# Patient Record
Sex: Female | Born: 1965 | Race: Black or African American | Hispanic: No | Marital: Married | Smoking: Never smoker
Health system: Southern US, Community
[De-identification: ages and names within clinical notes are randomized; demographics above are authoritative.]

## PROBLEM LIST (undated history)

## (undated) DIAGNOSIS — Z5189 Encounter for other specified aftercare: Secondary | ICD-10-CM

## (undated) HISTORY — DX: Encounter for other specified aftercare: Z51.89

---

## 2012-10-17 DIAGNOSIS — R059 Cough, unspecified: Secondary | ICD-10-CM | POA: Insufficient documentation

## 2014-04-06 DIAGNOSIS — B009 Herpesviral infection, unspecified: Secondary | ICD-10-CM | POA: Insufficient documentation

## 2014-04-30 DIAGNOSIS — S62619A Displaced fracture of proximal phalanx of unspecified finger, initial encounter for closed fracture: Secondary | ICD-10-CM | POA: Insufficient documentation

## 2014-08-24 DIAGNOSIS — S76219A Strain of adductor muscle, fascia and tendon of unspecified thigh, initial encounter: Secondary | ICD-10-CM | POA: Insufficient documentation

## 2014-08-24 DIAGNOSIS — E669 Obesity, unspecified: Secondary | ICD-10-CM | POA: Insufficient documentation

## 2014-08-24 DIAGNOSIS — R928 Other abnormal and inconclusive findings on diagnostic imaging of breast: Secondary | ICD-10-CM | POA: Insufficient documentation

## 2015-06-10 HISTORY — PX: COSMETIC SURGERY: SHX468

## 2019-09-29 ENCOUNTER — Other Ambulatory Visit: Payer: Self-pay

## 2019-09-30 ENCOUNTER — Other Ambulatory Visit: Payer: Self-pay

## 2019-09-30 ENCOUNTER — Ambulatory Visit: Payer: Managed Care, Other (non HMO) | Attending: Internal Medicine

## 2019-09-30 DIAGNOSIS — Z20822 Contact with and (suspected) exposure to covid-19: Secondary | ICD-10-CM

## 2019-10-01 LAB — NOVEL CORONAVIRUS, NAA: SARS-CoV-2, NAA: NOT DETECTED

## 2020-01-15 ENCOUNTER — Telehealth: Payer: Self-pay

## 2020-01-15 NOTE — Telephone Encounter (Signed)
Copied from CRM [id number]. Topic: Appointment Scheduling - Scheduling Inquiry for Clinic >> Jan 14, 2020  4:57 PM Randol Kern wrote: Reason for CRM: [id number]  pt is requesting a call back to discuss later options for new patient appt. I see Dr. B has 3:40 slots available, is pt able to have that? Please advise

## 2020-01-16 NOTE — Telephone Encounter (Signed)
LMTCB

## 2020-01-19 NOTE — Telephone Encounter (Signed)
LMTCB

## 2020-02-03 ENCOUNTER — Other Ambulatory Visit: Payer: Self-pay | Admitting: Adult Health

## 2020-02-03 DIAGNOSIS — Z1231 Encounter for screening mammogram for malignant neoplasm of breast: Secondary | ICD-10-CM

## 2020-02-09 ENCOUNTER — Encounter: Payer: Self-pay | Admitting: Radiology

## 2020-02-09 ENCOUNTER — Ambulatory Visit
Admission: RE | Admit: 2020-02-09 | Discharge: 2020-02-09 | Disposition: A | Discharge status: Home / Self Care | Payer: Managed Care, Other (non HMO) | Source: Ambulatory Visit | Attending: Adult Health | Admitting: Adult Health

## 2020-02-09 DIAGNOSIS — Z1231 Encounter for screening mammogram for malignant neoplasm of breast: Secondary | ICD-10-CM | POA: Diagnosis not present

## 2020-02-17 NOTE — Progress Notes (Signed)
Screening mammogram repeat in 1 year recommended by radiologist unless clinically indicated sooner.

## 2020-03-01 ENCOUNTER — Other Ambulatory Visit: Payer: Self-pay

## 2020-03-01 ENCOUNTER — Encounter: Payer: Self-pay | Admitting: Adult Health

## 2020-03-01 ENCOUNTER — Ambulatory Visit (INDEPENDENT_AMBULATORY_CARE_PROVIDER_SITE_OTHER): Payer: Managed Care, Other (non HMO) | Admitting: Adult Health

## 2020-03-01 VITALS — BP 110/78 | HR 74 | Temp 96.6°F | Resp 16 | Ht 66.0 in | Wt 208.4 lb

## 2020-03-01 DIAGNOSIS — Z1389 Encounter for screening for other disorder: Secondary | ICD-10-CM | POA: Diagnosis not present

## 2020-03-01 DIAGNOSIS — R5383 Other fatigue: Secondary | ICD-10-CM | POA: Diagnosis not present

## 2020-03-01 DIAGNOSIS — E559 Vitamin D deficiency, unspecified: Secondary | ICD-10-CM | POA: Diagnosis not present

## 2020-03-01 DIAGNOSIS — Z13220 Encounter for screening for lipoid disorders: Secondary | ICD-10-CM

## 2020-03-01 DIAGNOSIS — L659 Nonscarring hair loss, unspecified: Secondary | ICD-10-CM

## 2020-03-01 DIAGNOSIS — Z6833 Body mass index (BMI) 33.0-33.9, adult: Secondary | ICD-10-CM | POA: Diagnosis not present

## 2020-03-01 DIAGNOSIS — B369 Superficial mycosis, unspecified: Secondary | ICD-10-CM | POA: Insufficient documentation

## 2020-03-01 LAB — POCT URINALYSIS DIPSTICK
Bilirubin, UA: NEGATIVE
Blood, UA: NEGATIVE
Glucose, UA: NEGATIVE
Ketones, UA: NEGATIVE
Leukocytes, UA: NEGATIVE
Nitrite, UA: NEGATIVE
Protein, UA: NEGATIVE
Spec Grav, UA: <=1.005 — AB (ref 1.010–1.025)
Urobilinogen, UA: 0.2 E.U./dL
pH, UA: 7.5 (ref 5.0–8.0)

## 2020-03-01 MED ORDER — KETOCONAZOLE 2 % EX CREA: 1.0000 "application " | TOPICAL_CREAM | Freq: Two times a day (BID) | CUTANEOUS | 2 refills | Status: DC

## 2020-03-01 NOTE — Progress Notes (Addendum)
New patient visit   Patient: Nancy Randolph   DOB: Feb 04, 1966   54 y.o. Female  MRN: [id number] Visit Date: 03/01/2020  Today's healthcare provider: Jairo Ben, FNP   Chief Complaint  Patient presents with  . New Patient (Initial Visit)   Subjective    Nancy Randolph is a 54 y.o. female who presents today as a new patient to establish care.  HPI  Patient reports that she feels well today and would like to address concerns of hair loss for the past year and possible rash on the back of her left back of foot that has been present for the past 6 months. Patient describes rash as itchy and has noticed discoloration of skin. Patient states that she has been using otc Vicks to help treat rash. Patient reports that she is staying active by walking daily and states that sleep patterns are fairly well, she does wake up at least twice a night to use restroom.   Mammogram was 02/17/2020 needs again yearly screening recommended around 02/16/2021  She reports her bowels are not regular, she has bowel movement every three days or so. Not a lot of fiber in her diet.  Patient  denies any fever, body aches,chills, chest pain, shortness of breath, nausea, vomiting, or  diarrhea.   Mcderma covid vaccine completed bith vaccines.   September 2016 abdominal plasty. She has had some mild tenderness near her incision line she reports her surgeon tlld her was normal. She has not had any worsening symptoms.    History of two c/sections. Tubal .  She reports her last PAP smear was 2019 was within normal. Denies any vaginal bleeding or spotting. Denies any Vaginal discharge. She reports history of fibroids. clarification on 03/03/18 patient reports no history of fibroids and a history of " swollen thyroid " in past.   She had colonoscopy she reports it was normal, repeat in 10 years.  She feels this was in 2016.  She any denies any rectal pain or bleeding.  Denies any dark stools.   She sees  eye doctor- wears glasses. She is due.  She sees dentist.   Patient  denies any fever, body aches,chills, rash, chest pain, shortness of breath, nausea, vomiting, or diarrhea.  Denies dizziness, lightheadedness, pre syncopal or syncopal episodes.   Past Medical History:  Diagnosis Date  . Blood transfusion without reported diagnosis    Past Surgical History:  Procedure Laterality Date  . CESAREAN SECTION  [id number]  . COSMETIC SURGERY  06/2015   Family Status  Relation Name Status  . Mat Aunt  (Not Specified)   Family History  Problem Relation Age of Onset  . Breast cancer Maternal Aunt    Social History   Socioeconomic History  . Marital status: Married    Spouse name: Not on file  . Number of children: Not on file  . Years of education: Not on file  . Highest education level: Not on file  Occupational History  . Not on file  Tobacco Use  . Smoking status: Never Smoker  . Smokeless tobacco: Never Used  Substance and Sexual Activity  . Alcohol use: Not Currently  . Drug use: Never  . Sexual activity: Yes  Other Topics Concern  . Not on file  Social History Narrative  . Not on file   Social Determinants of Health   Financial Resource Strain:   . Difficulty of Paying Living Expenses:   Food Insecurity:   . Worried About  Running Out of Food in the Last Year:   . Ran Out of Food in the Last Year:   Transportation Needs:   . Lack of Transportation (Medical):   Marland Kitchen Lack of Transportation (Non-Medical):   Physical Activity:   . Days of Exercise per Week:   . Minutes of Exercise per Session:   Stress:   . Feeling of Stress :   Social Connections:   . Frequency of Communication with Friends and Family:   . Frequency of Social Gatherings with Friends and Family:   . Attends Religious Services:   . Active Member of Clubs or Organizations:   . Attends Banker Meetings:   Marland Kitchen Marital Status:    Outpatient Medications Prior to Visit  Medication Sig  .  calcium elemental as carbonate (BARIATRIC TUMS ULTRA) 400 MG chewable tablet Chew 1,000 mg by mouth daily.  . Cholecalciferol (VITAMIN D3) 50 MCG (2000 UT) TABS Take 50 mcg by mouth daily.  . cyanocobalamin 1000 MCG tablet Take 1,000 mcg by mouth daily.  Marland Kitchen esomeprazole (NEXIUM) 20 MG capsule Take 20 mg by mouth as needed.  . NON FORMULARY Take 500 mg by mouth daily. Beet Root  . TURMERIC CURCUMIN PO Take 100 mg by mouth daily.   No facility-administered medications prior to visit.   Allergies  Allergen Reactions  . Codeine Hives    (codeine) Hives and itching  Hives and itching  Hives and itching    . Oseltamivir Itching    Other reaction(s): Other (See Comments), Other (See Comments)     There is no immunization history on file for this patient.  Health Maintenance  Topic Date Due  . Hepatitis C Screening  Never done  . COVID-19 Vaccine (1) Never done  . HIV Screening  Never done  . TETANUS/TDAP  Never done  . PAP SMEAR-Modifier  Never done  . COLONOSCOPY  Never done  . INFLUENZA VACCINE  05/09/2020  . MAMMOGRAM  02/08/2022    Patient Care Team: Berniece Pap, FNP as PCP - General (Family Medicine)  Review of Systems  Constitutional: Positive for fatigue. Negative for activity change, appetite change, chills, diaphoresis, fever and unexpected weight change.  HENT: Positive for tinnitus.   Respiratory: Negative.   Cardiovascular: Negative.   Gastrointestinal: Positive for abdominal distention and constipation.  Genitourinary: Negative.   Musculoskeletal: Positive for back pain.  Skin: Positive for rash. Negative for color change, pallor and wound.  Hematological: Negative.   Psychiatric/Behavioral: Negative.   All other systems reviewed and are negative.   Last CBC No results found for: WBC, HGB, HCT, MCV, MCH, RDW, PLT Last metabolic panel No results found for: GLUCOSE, NA, K, CL, CO2, BUN, CREATININE, GFRNONAA, GFRAA, CALCIUM, PHOS, PROT, ALBUMIN,  LABGLOB, AGRATIO, BILITOT, ALKPHOS, AST, ALT, ANIONGAP Last lipids No results found for: CHOL, HDL, LDLCALC, LDLDIRECT, TRIG, CHOLHDL Last hemoglobin A1c No results found for: HGBA1C Last thyroid functions No results found for: TSH, T3TOTAL, T4TOTAL, THYROIDAB Last vitamin D No results found for: 25OHVITD2, 25OHVITD3, VD25OH Last vitamin B12 and Folate No results found for: VITAMINB12, FOLATE    Objective    BP 110/78   Pulse 74   Temp (!) 96.6 F (35.9 C) (Oral)   Resp 16   Ht 5\' 6"  (1.676 m)   Wt 208 lb 6.4 oz (94.5 kg)   SpO2 98%   BMI 33.64 kg/m  Physical Exam Constitutional:      General: She is not in acute distress.  Appearance: Normal appearance. She is obese. She is not ill-appearing, toxic-appearing or diaphoretic.  Cardiovascular:     Rate and Rhythm: Normal rate and regular rhythm.     Pulses: Normal pulses.     Heart sounds: Normal heart sounds. No murmur. No friction rub. No gallop.   Pulmonary:     Effort: Pulmonary effort is normal. No respiratory distress.     Breath sounds: Normal breath sounds. No stridor. No wheezing, rhonchi or rales.  Chest:     Chest wall: No tenderness.  Abdominal:     General: Bowel sounds are normal. There is no distension or abdominal bruit.     Palpations: Abdomen is soft. There is no mass or pulsatile mass.     Tenderness: There is abdominal tenderness (see note ). There is no right CVA tenderness, left CVA tenderness, guarding or rebound.     Hernia: No hernia is present.       Comments: Surgical scar from past abdominoplasty, mildly tender with palpation.   Musculoskeletal:        General: No swelling, tenderness, deformity or signs of injury. Normal range of motion.     Cervical back: Normal range of motion and neck supple.     Right lower leg: No edema.     Left lower leg: No edema.       Feet:  Feet:     Right foot:     Protective Sensation: 10 sites tested.     Left foot:     Protective Sensation: 10 sites  tested.     Comments: Hyperpigmented patch of skin left anterior leg/ heel as marked on diagram.  Skin:    General: Skin is warm.     Capillary Refill: Capillary refill takes less than 2 seconds.     Findings: Rash present.  Neurological:     General: No focal deficit present.     Mental Status: She is alert and oriented to person, place, and time.  Psychiatric:        Mood and Affect: Mood normal.        Behavior: Behavior normal.        Thought Content: Thought content normal.        Judgment: Judgment normal.      Depression Screen PHQ 2/9 Scores 03/01/2020  PHQ - 2 Score 0  PHQ- 9 Score 0   No results found for any visits on 03/01/20.  Assessment & Plan        Hair loss - Plan: TSH, CBC with Differential/Platelet, Comprehensive Metabolic Panel (CMET), Fe+TIBC+Fer, B12  Body mass index (BMI) of 33.0-33.9 in adult - Plan: VITAMIN D 25 Hydroxy (Vit-D Deficiency, Fractures)  Vitamin D deficiency - Plan: VITAMIN D 25 Hydroxy (Vit-D Deficiency, Fractures)  Fatigue, unspecified type - Plan: TSH, CBC with Differential/Platelet, Comprehensive Metabolic Panel (CMET), B12, HgB A1c  Screening cholesterol level - Plan: Lipid panel  Fungal infection of skin - Plan: ketoconazole (NIZORAL) 2 % cream  Screening for blood or protein in urine - Plan: POCT urinalysis dipstick   Orders Placed This Encounter  Procedures  . TSH  . CBC with Differential/Platelet  . Comprehensive Metabolic Panel (CMET)    Employee ID [id number]  . Lipid panel    Employee ID [id number]  . VITAMIN D 25 Hydroxy (Vit-D Deficiency, Fractures)    Employee ID [phone]  . Fe+TIBC+Fer    Employee ID [id number]  . B12    Employee ID [id number]  . HgB A1c  .  POCT urinalysis dipstick   Start psyllium husks or metamucil per package instructions mixed with at least 8 ounces of liquid/ per package instructions for constipation.   Fungal rash  Meds ordered this encounter  Medications  . ketoconazole (NIZORAL) 2 % cream     Sig: Apply 1 application topically 2 (two) times daily.    Dispense:  15 g    Refill:  2   Return for CPE, PAP and follow up on abdominal discomfort constipations. Diet and exercise lifestyle changes recommended.   Return in about 3 months (around 06/01/2020), or if symptoms worsen or fail to improve, for at any time for any worsening symptoms, Go to Emergency room/ urgent care if worse. IWellington Hampshire Flinchum, FNP, have reviewed all documentation for this visit. The documentation on 03/01/20 for the exam, diagnosis, procedures, and orders are all accurate and complete.   Marcille Buffy, [place] [phone] (phone) [phone] (fax)  [place]

## 2020-03-01 NOTE — Patient Instructions (Addendum)
Psyllium granules or powder for solution What is this medicine? PSYLLIUM (SIL i yum) is a bulk-forming fiber laxative. This medicine is used to treat constipation. Increasing fiber in the diet may also help lower cholesterol and promote heart health for some people. This medicine may be used for other purposes; ask your health care provider or pharmacist if you have questions. COMMON BRAND NAME(S): Fiber Therapy, GenFiber, Geri-Mucil, Hydrocil, Konsyl, Metamucil, Metamucil MultiHealth, Mucilin, Natural Fiber Therapy, Reguloid What should I tell my health care provider before I take this medicine? They need to know if you have any of these conditions:  blockage in your bowel  difficulty swallowing  inflammatory bowel disease  phenylketonuria  stomach or intestine problems  sudden change in bowel habits lasting more than 2 weeks  an unusual or allergic reaction to psyllium, other medicines, dyes, or preservatives  pregnant or trying or get pregnant  breast-feeding How should I use this medicine? Mix this medicine into a full glass (240 mL) of water or other cool drink. Take this medicine by mouth. Follow the directions on the package labeling, or take as directed by your health care professional. Take your medicine at regular intervals. Do not take your medicine more often than directed. Talk to your pediatrician regarding the use of this medicine in children. While this drug may be prescribed for children as young as 56 years old for selected conditions, precautions do apply. Overdosage: If you think you have taken too much of this medicine contact a poison control center or emergency room at once. NOTE: This medicine is only for you. Do not share this medicine with others. What if I miss a dose? If you miss a dose, take it as soon as you can. If it is almost time for your next dose, take only that dose. Do not take double or extra doses. What may interact with this  medicine? Interactions are not expected. Take this product at least 2 hours before or after other medicines. This list may not describe all possible interactions. Give your health care provider a list of all the medicines, herbs, non-prescription drugs, or dietary supplements you use. Also tell them if you smoke, drink alcohol, or use illegal drugs. Some items may interact with your medicine. What should I watch for while using this medicine? Check with your doctor or health care professional if your symptoms do not start to get better or if they get worse. Stop using this medicine and contact your doctor or health care professional if you have rectal bleeding or if you have to treat your constipation for more than 1 week. These could be signs of a more serious condition. Drink several glasses of water a day while you are taking this medicine. This will help to relieve constipation and prevent dehydration. What side effects may I notice from receiving this medicine? Side effects that you should report to your doctor or health care professional as soon as possible:  allergic reactions like skin rash, itching or hives, swelling of the face, lips, or tongue  breathing problems  chest pain  nausea, vomiting  rectal bleeding  trouble swallowing Side effects that usually do not require medical attention (report to your doctor or health care professional if they continue or are bothersome):  bloating  gas  stomach cramps This list may not describe all possible side effects. Call your doctor for medical advice about side effects. You may report side effects to FDA at 1-800-FDA-1088. Where should I  keep my medicine? Keep out of the reach of children. Store at room temperature between 15 and 30 degrees C (59 and 86 degrees F). Protect from moisture. Throw away any unused medicine after the expiration date. NOTE: This sheet is a summary. It may not cover all possible information. If you have  questions about this medicine, talk to your doctor, pharmacist, or health care provider.  2020 Elsevier/Gold Standard (2018-02-19 15:41:08) Psyllium oral capsule What is this medicine? PSYLLIUM (SIL i yum) is a bulk-forming fiber laxative. This medicine is used to treat constipation. Increasing fiber in the diet may also help lower cholesterol and promote heart health for some people. This medicine may be used for other purposes; ask your health care provider or pharmacist if you have questions. COMMON BRAND NAME(S): GenFiber, Konsyl, Metamucil, Metamucil MultiHealth, Natural Fiber Laxative, Reguloid What should I tell my health care provider before I take this medicine? They need to know if you have any of these conditions:  blockage in your bowel  difficulty swallowing  inflammatory bowel disease  stomach or intestine problems  sudden change in bowel habits lasting more than 2 weeks  an unusual or allergic reaction to psyllium, other medicines, dyes, or preservatives  pregnant or trying or get pregnant  breast-feeding How should I use this medicine? Take this medicine by mouth with a full glass of water. Follow the directions on the package labeling, or take as directed by your health care professional. Take your medicine at regular intervals. Do not take your medicine more often than directed. Talk to your pediatrician regarding the use of this medicine in children. While this drug may be prescribed for children as young as 22 years of age for selected conditions, precautions do apply. Overdosage: If you think you have taken too much of this medicine contact a poison control center or emergency room at once. NOTE: This medicine is only for you. Do not share this medicine with others. What if I miss a dose? If you miss a dose, take it as soon as you can. If it is almost time for your next dose, take only that dose. Do not take double or extra doses. What may interact with this  medicine? Interactions are not expected. Take this product at least 2 hours before or after other medicines. This list may not describe all possible interactions. Give your health care provider a list of all the medicines, herbs, non-prescription drugs, or dietary supplements you use. Also tell them if you smoke, drink alcohol, or use illegal drugs. Some items may interact with your medicine. What should I watch for while using this medicine? Check with your doctor or health care professional if your symptoms do not start to get better or if they get worse. Stop using this medicine and contact your doctor or health care professional if you have rectal bleeding or if you have to treat your constipation for more than 1 week. These could be signs of a more serious condition. Drink several glasses of water a day while you are taking this medicine. This will help to relieve constipation and prevent dehydration. What side effects may I notice from receiving this medicine? Side effects that you should report to your doctor or health care professional as soon as possible:  allergic reactions like skin rash, itching or hives, swelling of the face, lips, or tongue  breathing problems  chest pain  nausea, vomiting  rectal bleeding  trouble swallowing Side effects that usually do not require medical  attention (report to your doctor or health care professional if they continue or are bothersome):  bloating  gas  stomach cramps This list may not describe all possible side effects. Call your doctor for medical advice about side effects. You may report side effects to FDA at 1-800-FDA-1088. Where should I keep my medicine? Keep out of the reach of children. Store at room temperature between 15 and 30 degrees C (59 and 86 degrees F). Protect from moisture. Throw away any unused medicine after the expiration date. NOTE: This sheet is a summary. It may not cover all possible information. If you have  questions about this medicine, talk to your doctor, pharmacist, or health care provider.  2020 Elsevier/Gold Standard (2018-02-19 15:56:42)   Ketoconazole skin cream What is this medicine? KETOCONAZOLE (kee toe KON na zole) is an antifungal medicine. This cream is used to treat certain kinds of fungal or yeast infections of the skin. This medicine may be used for other purposes; ask your health care provider or pharmacist if you have questions. COMMON BRAND NAME(S): Kuric, Nizoral What should I tell my health care provider before I take this medicine? They need to know if you have any of these conditions:  an unusual or allergic reaction to ketoconazole, itraconazole, miconazole, sulfites, other foods, dyes or preservatives  pregnant or trying to get pregnant  breast-feeding How should I use this medicine? This medicine is for external use only. Follow the directions on the prescription label. Wash your hands before and after use. If treating a hand or nail infection, wash hands before use only. Apply a thin layer of cream to cover the affected skin and surrounding area. You can cover the area with a sterile gauze dressing or bandage. Do not use an airtight bandage, such as a plastic covered bandage. Do not get the cream in your eyes. If you do, rinse out with plenty of cool tap water. Finish the full course prescribed by your doctor or health care professional even if you think your condition is better. Do not stop using except on the advice of your doctor or health care professional. Talk to your pediatrician regarding the use of this medicine in children. Special care may be needed. Overdosage: If you think you have taken too much of this medicine contact a poison control center or emergency room at once. NOTE: This medicine is only for you. Do not share this medicine with others. What if I miss a dose? If you miss a dose, use it as soon as you can. If it is almost time for your next dose,  use only that dose. Do not use double or extra doses. What may interact with this medicine? Interactions are not expected. Do not use any other skin products without telling your doctor or health care professional. This list may not describe all possible interactions. Give your health care provider a list of all the medicines, herbs, non-prescription drugs, or dietary supplements you use. Also tell them if you smoke, drink alcohol, or use illegal drugs. Some items may interact with your medicine. What should I watch for while using this medicine? Tell your doctor or health care professional if your symptoms do not begin to improve in 1 to 2 weeks. If you are using this medicine for jock itch be sure to dry the groin completely after bathing. Do not wear underwear that is tight fitting or made from synthetic fibers like rayon or nylon. Wear loose fitting, cotton underwear. What side effects may  I notice from receiving this medicine? Side effects that you should report to your doctor or health care professional as soon as possible:  allergic reactions like skin rash, itching or hives, swelling of the face, lips, or tongue  pain, tingling, numbness Side effects that usually do not require medical attention (report to your doctor or health care professional if they continue or are bothersome):  dry skin  skin irritation This list may not describe all possible side effects. Call your doctor for medical advice about side effects. You may report side effects to FDA at 1-800-FDA-1088. Where should I keep my medicine? Keep out of the reach of children. Store at room temperature between 15 and 30 degrees C (59 and 86 degrees F). Keep container tightly closed. Throw away any unused medicine after the expiration date. NOTE: This sheet is a summary. It may not cover all possible information. If you have questions about this medicine, talk to your doctor, pharmacist, or health care provider.  2020  Elsevier/Gold Standard (2017-05-09 15:51:40)   Fat and Cholesterol Restricted Eating Plan Getting too much fat and cholesterol in your diet may cause health problems. Choosing the right foods helps keep your fat and cholesterol at normal levels. This can keep you from getting certain diseases. Your doctor may recommend an eating plan that includes:  Total fat: ______% or less of total calories a day.  Saturated fat: ______% or less of total calories a day.  Cholesterol: less than _________mg a day.  Fiber: ______g a day. What are tips for following this plan? Meal planning  At meals, divide your plate into four equal parts: ? Fill one-half of your plate with vegetables and green salads. ? Fill one-fourth of your plate with whole grains. ? Fill one-fourth of your plate with low-fat (lean) protein foods.  Eat fish that is high in omega-3 fats at least two times a week. This includes mackerel, tuna, sardines, and salmon.  Eat foods that are high in fiber, such as whole grains, beans, apples, broccoli, carrots, peas, and barley. General tips   Work with your doctor to lose weight if you need to.  Avoid: ? Foods with added sugar. ? Fried foods. ? Foods with partially hydrogenated oils.  Limit alcohol intake to no more than 1 drink a day for nonpregnant women and 2 drinks a day for men. One drink equals 12 oz of beer, 5 oz of wine, or 1 oz of hard liquor. Reading food labels  Check food labels for: ? Trans fats. ? Partially hydrogenated oils. ? Saturated fat (g) in each serving. ? Cholesterol (mg) in each serving. ? Fiber (g) in each serving.  Choose foods with healthy fats, such as: ? Monounsaturated fats. ? Polyunsaturated fats. ? Omega-3 fats.  Choose grain products that have whole grains. Look for the word "whole" as the first word in the ingredient list. Cooking  Cook foods using low-fat methods. These include baking, boiling, grilling, and broiling.  Eat more  home-cooked foods. Eat at restaurants and buffets less often.  Avoid cooking using saturated fats, such as butter, cream, palm oil, palm kernel oil, and coconut oil. Recommended foods  Fruits  All fresh, canned (in natural juice), or frozen fruits. Vegetables  Fresh or frozen vegetables (raw, steamed, roasted, or grilled). Green salads. Grains  Whole grains, such as whole wheat or whole grain breads, crackers, cereals, and pasta. Unsweetened oatmeal, bulgur, barley, quinoa, or brown rice. Corn or whole wheat flour tortillas. Meats and other protein  foods  Ground beef (85% or leaner), grass-fed beef, or beef trimmed of fat. Skinless chicken or [place]. Ground chicken or [place]. Pork trimmed of fat. All fish and seafood. Egg whites. Dried beans, peas, or lentils. Unsalted nuts or seeds. Unsalted canned beans. Nut butters without added sugar or oil. Dairy  Low-fat or nonfat dairy products, such as skim or 1% milk, 2% or reduced-fat cheeses, low-fat and fat-free ricotta or cottage cheese, or plain low-fat and nonfat yogurt. Fats and oils  Tub margarine without trans fats. Light or reduced-fat mayonnaise and salad dressings. Avocado. Olive, canola, sesame, or safflower oils. The items listed above may not be a complete list of foods and beverages you can eat. Contact a dietitian for more information. Foods to avoid Fruits  Canned fruit in heavy syrup. Fruit in cream or butter sauce. Fried fruit. Vegetables  Vegetables cooked in cheese, cream, or butter sauce. Fried vegetables. Grains  White bread. White pasta. White rice. Cornbread. Bagels, pastries, and croissants. Crackers and snack foods that contain trans fat and hydrogenated oils. Meats and other protein foods  Fatty cuts of meat. Ribs, chicken wings, bacon, sausage, bologna, salami, chitterlings, fatback, hot dogs, bratwurst, and packaged lunch meats. Liver and organ meats. Whole eggs and egg yolks. Chicken and [place] with skin.  Fried meat. Dairy  Whole or 2% milk, cream, half-and-half, and cream cheese. Whole milk cheeses. Whole-fat or sweetened yogurt. Full-fat cheeses. Nondairy creamers and whipped toppings. Processed cheese, cheese spreads, and cheese curds. Beverages  Alcohol. Sugar-sweetened drinks such as sodas, lemonade, and fruit drinks. Fats and oils  Butter, stick margarine, lard, shortening, ghee, or bacon fat. Coconut, palm kernel, and palm oils. Sweets and desserts  Corn syrup, sugars, honey, and molasses. Candy. Jam and jelly. Syrup. Sweetened cereals. Cookies, pies, cakes, donuts, muffins, and ice cream. The items listed above may not be a complete list of foods and beverages you should avoid. Contact a dietitian for more information. Summary  Choosing the right foods helps keep your fat and cholesterol at normal levels. This can keep you from getting certain diseases.  At meals, fill one-half of your plate with vegetables and green salads.  Eat high-fiber foods, like whole grains, beans, apples, carrots, peas, and barley.  Limit added sugar, saturated fats, alcohol, and fried foods. This information is not intended to replace advice given to you by your health care provider. Make sure you discuss any questions you have with your health care provider. Document Revised: 05/29/2018 Document Reviewed: 06/12/2017 Elsevier Patient Education  2020 [employer].

## 2020-03-02 ENCOUNTER — Telehealth: Payer: Self-pay

## 2020-03-02 ENCOUNTER — Other Ambulatory Visit: Payer: Self-pay | Admitting: Adult Health

## 2020-03-02 DIAGNOSIS — Z1152 Encounter for screening for COVID-19: Secondary | ICD-10-CM

## 2020-03-02 LAB — COMPREHENSIVE METABOLIC PANEL
ALT: 17 IU/L (ref 0–32)
AST: 22 IU/L (ref 0–40)
Albumin/Globulin Ratio: 1.6 (ref 1.2–2.2)
Albumin: 4.4 g/dL (ref 3.8–4.9)
Alkaline Phosphatase: 85 IU/L (ref 48–121)
BUN/Creatinine Ratio: 13 (ref 9–23)
BUN: 15 mg/dL (ref 6–24)
Bilirubin Total: 0.2 mg/dL (ref 0.0–1.2)
CO2: 23 mmol/L (ref 20–29)
Calcium: 9.6 mg/dL (ref 8.7–10.2)
Chloride: 101 mmol/L (ref 96–106)
Creatinine, Ser: 1.15 mg/dL — ABNORMAL HIGH (ref 0.57–1.00)
GFR calc Af Amer: 62 mL/min/{1.73_m2} (ref >59)
GFR calc non Af Amer: 54 mL/min/{1.73_m2} — ABNORMAL LOW (ref >59)
Globulin, Total: 2.7 g/dL (ref 1.5–4.5)
Glucose: 83 mg/dL (ref 65–99)
Potassium: 4.8 mmol/L (ref 3.5–5.2)
Sodium: 139 mmol/L (ref 134–144)
Total Protein: 7.1 g/dL (ref 6.0–8.5)

## 2020-03-02 LAB — CBC WITH DIFFERENTIAL/PLATELET
Basophils Absolute: 0.1 10*3/uL (ref 0.0–0.2)
Basos: 1 %
EOS (ABSOLUTE): 0.2 10*3/uL (ref 0.0–0.4)
Eos: 3 %
Hematocrit: 38.9 % (ref 34.0–46.6)
Hemoglobin: 13.3 g/dL (ref 11.1–15.9)
Immature Grans (Abs): 0 10*3/uL (ref 0.0–0.1)
Immature Granulocytes: 0 %
Lymphocytes Absolute: 2.2 10*3/uL (ref 0.7–3.1)
Lymphs: 35 %
MCH: 27.6 pg (ref 26.6–33.0)
MCHC: 34.2 g/dL (ref 31.5–35.7)
MCV: 81 fL (ref 79–97)
Monocytes Absolute: 0.4 10*3/uL (ref 0.1–0.9)
Monocytes: 7 %
Neutrophils Absolute: 3.3 10*3/uL (ref 1.4–7.0)
Neutrophils: 54 %
Platelets: 242 10*3/uL (ref 150–450)
RBC: 4.82 x10E6/uL (ref 3.77–5.28)
RDW: 13.5 % (ref 11.7–15.4)
WBC: 6.1 10*3/uL (ref 3.4–10.8)

## 2020-03-02 LAB — VITAMIN D 25 HYDROXY (VIT D DEFICIENCY, FRACTURES): Vit D, 25-Hydroxy: 42.1 ng/mL (ref 30.0–100.0)

## 2020-03-02 LAB — IRON,TIBC AND FERRITIN PANEL
Ferritin: 112 ng/mL (ref 15–150)
Iron Saturation: 21 % (ref 15–55)
Iron: 68 ug/dL (ref 27–159)
Total Iron Binding Capacity: 322 ug/dL (ref 250–450)
UIBC: 254 ug/dL (ref 131–425)

## 2020-03-02 LAB — LIPID PANEL
Chol/HDL Ratio: 3.7 ratio (ref 0.0–4.4)
Cholesterol, Total: 190 mg/dL (ref 100–199)
HDL: 51 mg/dL (ref >39)
LDL Chol Calc (NIH): 123 mg/dL — ABNORMAL HIGH (ref 0–99)
Triglycerides: 87 mg/dL (ref 0–149)
VLDL Cholesterol Cal: 16 mg/dL (ref 5–40)

## 2020-03-02 LAB — VITAMIN B12: Vitamin B-12: 1018 pg/mL (ref 232–1245)

## 2020-03-02 LAB — HEMOGLOBIN A1C
Est. average glucose Bld gHb Est-mCnc: 117 mg/dL
Hgb A1c MFr Bld: 5.7 % — ABNORMAL HIGH (ref 4.8–5.6)

## 2020-03-02 LAB — TSH: TSH: 1.9 u[IU]/mL (ref 0.450–4.500)

## 2020-03-02 NOTE — Telephone Encounter (Signed)
-----   Message from Berniece Pap, FNP sent at 03/02/2020 12:57 PM EDT ----- TSH for thyroid within normal limits.  CBC is within normal limits  CMP mildly decreased kidney function, advise increased hydration with water, avoiding NSAID's ibuprofen, aleve, advil etc.  Total cholesterol. Discuss lifestyle modification with patient e.g. increase exercise, fiber, fruits, vegetables, lean meat, and omega 3/fish intake and decrease saturated fat.  Vitamin D level is within normal limits and she can continue taking her Vitamin D at 4,000 international units daily.  Iron level . Ferritin and iron binding within normal limits.  B12 within normal limits.  Glucose within normal range, she has mild elevation in A1C  - 5.7 - prediabetes, however glucose in normal at 83. Likely normal variant on Hemoglobin A1C. Continue healthy diet and increased exercise.  We can recheck CMP for kidney function fasting in 3 months.

## 2020-03-02 NOTE — Telephone Encounter (Signed)
Left message for patient to call office back, okay for [facility] triage to advise. KW 

## 2020-03-02 NOTE — Telephone Encounter (Signed)
I placed antibody test order  if you will send to lab. I have used diagnosis code of screening for covid.

## 2020-03-02 NOTE — Progress Notes (Signed)
TSH for thyroid within normal limits.  CBC is within normal limits  CMP mildly decreased kidney function, advise increased hydration with water, avoiding NSAID's ibuprofen, aleve, advil etc.  Total cholesterol. Discuss lifestyle modification with patient e.g. increase exercise, fiber, fruits, vegetables, lean meat, and omega 3/fish intake and decrease saturated fat.  Vitamin D level is within normal limits and she can continue taking her Vitamin D at 4,000 international units daily.  Iron level . Ferritin and iron binding within normal limits.  B12 within normal limits.  Glucose within normal range, she has mild elevation in A1C  - 5.7 - prediabetes, however glucose in normal at 83. Likely normal variant on Hemoglobin A1C. Continue healthy diet and increased exercise.  We can recheck CMP for kidney function fasting in 3 months.

## 2020-03-02 NOTE — Telephone Encounter (Signed)
Result note read to pt, verbalizes understanding. Pt states she had requested Covid antibodies screening be added to blood work from yesterday, did not see noted. Also states during exam she was asked her history and replied she was told at one time in the past her "Thyroid was swollen";  pt states it was noted in her chart H/O fibroids. States she just wanted Ms. Flinchum to be aware of error. Aware of lab f/u in 3 months.

## 2020-03-02 NOTE — Telephone Encounter (Signed)
This was not a lab ordered, do you want to order this or should I triage patient? KW

## 2020-03-02 NOTE — Telephone Encounter (Signed)
Copied from CRM [id number]. Topic: General - Call Back - No Documentation >> Mar 01, 2020  4:55 PM Nancy Randolph wrote: Reason for CRM: Patient was seen on today for lab work and wanted to make sue that the Covid-19 antibodies lab work was included as well.

## 2020-03-03 NOTE — Telephone Encounter (Signed)
Covid antibodies lab ok to add on and I added to orders in epic.   Please clarify the below message with patient, she did ask me in the room the difference between the thyroid and fibroids and I explained the difference and location. I understood her to say she had a history of fibroids in her uterus in the past as well.   Her thyroid function test and exam of thyroid in office was normal. I will be happy to check it again in the office, at  follow up. Had she previously had any work up or treatment for her thyroid that I am not aware of or did it resolve ?

## 2020-03-03 NOTE — Telephone Encounter (Signed)
Will remove from note. Patient did ask question of " what is the difference in thyroid and fibroids" and was clarified in office by provider. Will follow up more at next office visit regarding history of swollen thyroid.

## 2020-03-03 NOTE — Telephone Encounter (Signed)
Spoke with patient and she states that you mistook her in the room and apologizes if fibroid and thyroid sounded similar. Patient states that over ten years ago when she was having exam done doctor had mentioned that her thyroid was swollen, patient states she is unsure if it was on left or right side but states that since exams from then on she was never told it was swollen or she had a thyroid problem. KW

## 2020-03-03 NOTE — Progress Notes (Signed)
Patient ID: Nancy Randolph, female   DOB: 12-10-1965, 54 y.o.   MRN: [id number]  Addendum: patient reports history of swollen thyroid in past. On exam was normal at last office visit. Patient did ask provider " what is the difference in thyroid and fibroid " question was answered. Provider must have misunderstood patient saying she had a history of fibroids. She clarified to CMA on phone after visit and reviewing note in [facility] she denies any history of fibroids.  Will follow up on thyroid at next visit.

## 2020-03-10 LAB — SPECIMEN STATUS REPORT

## 2020-03-15 ENCOUNTER — Telehealth: Payer: Self-pay

## 2020-03-15 NOTE — Telephone Encounter (Signed)
Patient returned call and states that she did reviewed her lab results in MyChart.

## 2020-03-15 NOTE — Telephone Encounter (Signed)
-----   Message from Fonda Kinder, CMA sent at 03/10/2020  4:14 PM EDT ----- Patient called.  Left message for patient to call back.

## 2020-03-15 NOTE — Telephone Encounter (Signed)
Tried calling patient and no answer, advised patient to return call if patient calls back okay for [facility] to advise patient of results. Also patient was advised that she could review labs/message via MyChart.

## 2020-03-15 NOTE — Telephone Encounter (Signed)
Results viewed in MyChart on 03/15/20.

## 2020-03-18 ENCOUNTER — Ambulatory Visit: Payer: Managed Care, Other (non HMO) | Admitting: Family Medicine
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
## 2020-04-09 ENCOUNTER — Other Ambulatory Visit: Payer: Self-pay

## 2020-04-09 ENCOUNTER — Encounter: Payer: Self-pay | Admitting: Adult Health

## 2020-04-09 ENCOUNTER — Ambulatory Visit (INDEPENDENT_AMBULATORY_CARE_PROVIDER_SITE_OTHER): Payer: Managed Care, Other (non HMO) | Admitting: Adult Health

## 2020-04-09 VITALS — BP 112/68 | HR 80 | Temp 97.6°F | Resp 16 | Ht 66.0 in | Wt 211.0 lb

## 2020-04-09 DIAGNOSIS — J0110 Acute frontal sinusitis, unspecified: Secondary | ICD-10-CM

## 2020-04-09 DIAGNOSIS — M25552 Pain in left hip: Secondary | ICD-10-CM

## 2020-04-09 DIAGNOSIS — G44209 Tension-type headache, unspecified, not intractable: Secondary | ICD-10-CM

## 2020-04-09 DIAGNOSIS — B369 Superficial mycosis, unspecified: Secondary | ICD-10-CM | POA: Diagnosis not present

## 2020-04-09 DIAGNOSIS — H6983 Other specified disorders of Eustachian tube, bilateral: Secondary | ICD-10-CM

## 2020-04-09 DIAGNOSIS — N289 Disorder of kidney and ureter, unspecified: Secondary | ICD-10-CM

## 2020-04-09 DIAGNOSIS — H6501 Acute serous otitis media, right ear: Secondary | ICD-10-CM

## 2020-04-09 MED ORDER — FLUTICASONE PROPIONATE 50 MCG/ACT NA SUSP: 2.0000 | Freq: Every day | NASAL | 1 refills | Status: DC

## 2020-04-09 MED ORDER — CETIRIZINE HCL 10 MG PO TABS: 10.0000 mg | ORAL_TABLET | Freq: Every day | ORAL | 0 refills | Status: AC

## 2020-04-09 MED ORDER — AMOXICILLIN-POT CLAVULANATE 875-125 MG PO TABS: 1.0000 | ORAL_TABLET | Freq: Two times a day (BID) | ORAL | 0 refills | Status: DC

## 2020-04-09 MED ORDER — CETIRIZINE HCL 10 MG PO TABS: 10.0000 mg | ORAL_TABLET | Freq: Every day | ORAL | 0 refills | Status: DC

## 2020-04-09 MED ORDER — KETOCONAZOLE 2 % EX SHAM: 1.0000 "application " | MEDICATED_SHAMPOO | CUTANEOUS | 0 refills | Status: AC

## 2020-04-09 NOTE — Patient Instructions (Addendum)
Otitis Media, Adult  Otitis media means that the middle ear is red and swollen (inflamed) and full of fluid. The condition usually goes away on its own. Follow these instructions at home:  Take over-the-counter and prescription medicines only as told by your doctor.  If you were prescribed an antibiotic medicine, take it as told by your doctor. Do not stop taking the antibiotic even if you start to feel better.  Keep all follow-up visits as told by your doctor. This is important. Contact a doctor if:  You have bleeding from your nose.  There is a lump on your neck.  You are not getting better in 5 days.  You feel worse instead of better. Get help right away if:  You have pain that is not helped with medicine.  You have swelling, redness, or pain around your ear.  You get a stiff neck.  You cannot move part of your face (paralyzed).  You notice that the bone behind your ear hurts when you touch it.  You get a very bad headache. Summary  Otitis media means that the middle ear is red, swollen, and full of fluid.  This condition usually goes away on its own. In some cases, treatment may be needed.  If you were prescribed an antibiotic medicine, take it as told by your doctor. This information is not intended to replace advice given to you by your health care provider. Make sure you discuss any questions you have with your health care provider. Document Revised: 09/07/2017 Document Reviewed: 10/16/2016 Elsevier Patient Education  2020 [employer] Fluticasone nasal spray What is this medicine? FLUTICASONE (floo TIK a sone) is a corticosteroid. This medicine is used to treat the symptoms of allergies like sneezing, itchy red eyes, and itchy, runny, or stuffy nose. This medicine is also used to treat nasal polyps. This medicine may be used for other purposes; ask your health care provider or pharmacist if you have questions. COMMON BRAND NAME(S): ClariSpray, Flonase,  Flonase Allergy Relief, Flonase Sensimist, Veramyst, XHANCE What should I tell my health care provider before I take this medicine? They need to know if you have any of these conditions:  eye disease, vision problems  infection, like tuberculosis, herpes, or fungal infection  recent surgery on nose or sinuses  taking a corticosteroid by mouth  an unusual or allergic reaction to fluticasone, steroids, other medicines, foods, dyes, or preservatives  pregnant or trying to get pregnant  breast-feeding How should I use this medicine? This medicine is for use in the nose. Follow the directions on your product or prescription label. This medicine works best if used at regular intervals. Do not use more often than directed. Make sure that you are using your nasal spray correctly. After 6 months of daily use for allergies, talk to your doctor or health care professional before using it for a longer time. Ask your doctor or health care professional if you have any questions. Talk to your pediatrician regarding the use of this medicine in children. Special care may be needed. Some products have been used for allergies in children as young as 2 years. After 2 months of daily use without a prescription in a child, talk to your pediatrician before using it for a longer time. Use of this medicine for nasal polyps is not approved in children. Overdosage: If you think you have taken too much of this medicine contact a poison control center or emergency room at once. NOTE: This medicine is  only for you. Do not share this medicine with others. What if I miss a dose? If you miss a dose, use it as soon as you remember. If it is almost time for your next dose, use only that dose and continue with your regular schedule. Do not use double or extra doses. What may interact with this medicine?  certain antibiotics like clarithromycin and telithromycin  certain medicines for fungal infections like ketoconazole,  itraconazole, and voriconazole  conivaptan  nefazodone  some medicines for HIV  vaccines This list may not describe all possible interactions. Give your health care provider a list of all the medicines, herbs, non-prescription drugs, or dietary supplements you use. Also tell them if you smoke, drink alcohol, or use illegal drugs. Some items may interact with your medicine. What should I watch for while using this medicine? Visit your healthcare professional for regular checks on your progress. Tell your healthcare professional if your symptoms do not start to get better or if they get worse. This medicine may increase your risk of getting an infection. Tell your doctor or health care professional if you are around anyone with measles or chickenpox, or if you develop sores or blisters that do not heal properly. What side effects may I notice from receiving this medicine? Side effects that you should report to your doctor or health care professional as soon as possible:  allergic reactions like skin rash, itching or hives, swelling of the face, lips, or tongue  changes in vision  crusting or sores in the nose  nosebleed  signs and symptoms of infection like fever or chills; cough; sore throat  white patches or sores in the mouth or nose Side effects that usually do not require medical attention (report to your doctor or health care professional if they continue or are bothersome):  burning or irritation inside the nose or throat  changes in taste or smell  cough  headache This list may not describe all possible side effects. Call your doctor for medical advice about side effects. You may report side effects to FDA at 1-800-FDA-1088. Where should I keep my medicine? Keep out of the reach of children. Store at room temperature between 15 and 30 degrees C (59 and 86 degrees F). Avoid exposure to extreme heat, cold, or light. Throw away any unused medicine after the expiration  date. NOTE: This sheet is a summary. It may not cover all possible information. If you have questions about this medicine, talk to your doctor, pharmacist, or health care provider.  2020 Elsevier/Gold Standard (2017-10-18 14:10:08) Cetirizine tablets What is this medicine? CETIRIZINE (se TI ra zeen) is an antihistamine. This medicine is used to treat or prevent symptoms of allergies. It is also used to help reduce itchy skin rash and hives. This medicine may be used for other purposes; ask your health care provider or pharmacist if you have questions. COMMON BRAND NAME(S): All Day Allergy, Allergy Relief, Zyrtec, Zyrtec Hives Relief What should I tell my health care provider before I take this medicine? They need to know if you have any of these conditions:  kidney disease  liver disease  an unusual or allergic reaction to cetirizine, hydroxyzine, other medicines, foods, dyes, or preservatives  pregnant or trying to get pregnant  breast-feeding How should I use this medicine? Take this medicine by mouth with a glass of water. Follow the directions on the prescription label. You can take this medicine with food or on an empty stomach. Take your medicine  at regular times. Do not take more often than directed. You may need to take this medicine for several days before your symptoms improve. Talk to your pediatrician regarding the use of this medicine in children. Special care may be needed. While this drug may be prescribed for children as young as 65 years of age for selected conditions, precautions do apply. Overdosage: If you think you have taken too much of this medicine contact a poison control center or emergency room at once. NOTE: This medicine is only for you. Do not share this medicine with others. What if I miss a dose? If you miss a dose, take it as soon as you can. If it is almost time for your next dose, take only that dose. Do not take double or extra doses. What may interact  with this medicine?  alcohol  certain medicines for anxiety or sleep  narcotic medicines for pain  other medicines for colds or allergies This list may not describe all possible interactions. Give your health care provider a list of all the medicines, herbs, non-prescription drugs, or dietary supplements you use. Also tell them if you smoke, drink alcohol, or use illegal drugs. Some items may interact with your medicine. What should I watch for while using this medicine? Visit your doctor or health care professional for regular checks on your health. Tell your doctor if your symptoms do not improve. You may get drowsy or dizzy. Do not drive, use machinery, or do anything that needs mental alertness until you know how this medicine affects you. Do not stand or sit up quickly, especially if you are an older patient. This reduces the risk of dizzy or fainting spells. Your mouth may get dry. Chewing sugarless gum or sucking hard candy, and drinking plenty of water may help. Contact your doctor if the problem does not go away or is severe. What side effects may I notice from receiving this medicine? Side effects that you should report to your doctor or health care professional as soon as possible:  allergic reactions like skin rash, itching or hives, swelling of the face, lips, or tongue  changes in vision or hearing  fast or irregular heartbeat  trouble passing urine or change in the amount of urine Side effects that usually do not require medical attention (report to your doctor or health care professional if they continue or are bothersome):  dizziness  dry mouth  irritability  sore throat  stomach pain  tiredness This list may not describe all possible side effects. Call your doctor for medical advice about side effects. You may report side effects to FDA at 1-800-FDA-1088. Where should I keep my medicine? Keep out of the reach of children. Store at room temperature between 15  and 30 degrees C (59 and 86 degrees F). Throw away any unused medicine after the expiration date. NOTE: This sheet is a summary. It may not cover all possible information. If you have questions about this medicine, talk to your doctor, pharmacist, or health care provider.  2020 Elsevier/Gold Standard (2014-10-20 13:44:42) Amoxicillin; Clavulanic Acid Tablets What is this medicine? AMOXICILLIN; CLAVULANIC ACID (a mox i SIL in; KLAV yoo lan ic AS id) is a penicillin antibiotic. It treats some infections caused by bacteria. It will not work for colds, the flu, or other viruses. This medicine may be used for other purposes; ask your health care provider or pharmacist if you have questions. COMMON BRAND NAME(S): Augmentin What should I tell my health care provider  before I take this medicine? They need to know if you have any of these conditions:  bowel disease, like colitis  kidney disease  liver disease  mononucleosis  an unusual or allergic reaction to amoxicillin, penicillin, cephalosporin, other antibiotics, clavulanic acid, other medicines, foods, dyes, or preservatives  pregnant or trying to get pregnant  breast-feeding How should I use this medicine? Take this drug by mouth. Take it as directed on the prescription label at the same time every day. Take it with food at the start of a meal or snack. Take all of this drug unless your health care provider tells you to stop it early. Keep taking it even if you think you are better. Talk to your health care provider about the use of this drug in children. While it may be prescribed for selected conditions, precautions do apply. Overdosage: If you think you have taken too much of this medicine contact a poison control center or emergency room at once. NOTE: This medicine is only for you. Do not share this medicine with others. What if I miss a dose? If you miss a dose, take it as soon as you can. If it is almost time for your next dose,  take only that dose. Do not take double or extra doses. What may interact with this medicine?  allopurinol  anticoagulants  birth control pills  methotrexate  probenecid This list may not describe all possible interactions. Give your health care provider a list of all the medicines, herbs, non-prescription drugs, or dietary supplements you use. Also tell them if you smoke, drink alcohol, or use illegal drugs. Some items may interact with your medicine. What should I watch for while using this medicine? Tell your doctor or healthcare provider if your symptoms do not improve. This medicine may cause serious skin reactions. They can happen weeks to months after starting the medicine. Contact your healthcare provider right away if you notice fevers or flu-like symptoms with a rash. The rash may be red or purple and then turn into blisters or peeling of the skin. Or, you might notice a red rash with swelling of the face, lips or lymph nodes in your neck or under your arms. Do not treat diarrhea with over the counter products. Contact your doctor if you have diarrhea that lasts more than 2 days or if it is severe and watery. If you have diabetes, you may get a false-positive result for sugar in your urine. Check with your doctor or healthcare provider. Birth control pills may not work properly while you are taking this medicine. Talk to your doctor about using an extra method of birth control. What side effects may I notice from receiving this medicine? Side effects that you should report to your doctor or health care professional as soon as possible:  allergic reactions like skin rash, itching or hives, swelling of the face, lips, or tongue  breathing problems  dark urine  fever or chills, sore throat  redness, blistering, peeling, or loosening of the skin, including inside the mouth  seizures  trouble passing urine or change in the amount of urine  unusual bleeding,  bruising  unusually weak or tired  white patches or sores in the mouth or throat Side effects that usually do not require medical attention (report to your doctor or health care professional if they continue or are bothersome):  diarrhea  dizziness  headache  nausea, vomiting  stomach upset  vaginal or anal irritation This list may not  describe all possible side effects. Call your doctor for medical advice about side effects. You may report side effects to FDA at 1-800-FDA-1088. Where should I keep my medicine? Keep out of the reach of children and pets. Store at room temperature between 20 and 25 degrees C (68 and 77 degrees F). Throw away any unused drug after the expiration date. NOTE: This sheet is a summary. It may not cover all possible information. If you have questions about this medicine, talk to your doctor, pharmacist, or health care provider.  2020 Elsevier/Gold Standard (2019-04-28 11:55:53)  Eustachian Tube Dysfunction  Eustachian tube dysfunction refers to a condition in which a blockage develops in the narrow passage that connects the middle ear to the back of the nose (eustachian tube). The eustachian tube regulates air pressure in the middle ear by letting air move between the ear and nose. It also helps to drain fluid from the middle ear space. Eustachian tube dysfunction can affect one or both ears. When the eustachian tube does not function properly, air pressure, fluid, or both can build up in the middle ear. What are the causes? This condition occurs when the eustachian tube becomes blocked or cannot open normally. Common causes of this condition include:  Ear infections.  Colds and other infections that affect the nose, mouth, and throat (upper respiratory tract).  Allergies.  Irritation from cigarette smoke.  Irritation from stomach acid coming up into the esophagus (gastroesophageal reflux). The esophagus is the tube that carries food from the mouth  to the stomach.  Sudden changes in air pressure, such as from descending in an airplane or scuba diving.  Abnormal growths in the nose or throat, such as: ? Growths that line the nose (nasal polyps). ? Abnormal growth of cells (tumors). ? Enlarged tissue at the back of the throat (adenoids). What increases the risk? You are more likely to develop this condition if:  You smoke.  You are overweight.  You are a child who has: ? Certain birth defects of the mouth, such as cleft palate. ? Large tonsils or adenoids. What are the signs or symptoms? Common symptoms of this condition include:  A feeling of fullness in the ear.  Ear pain.  Clicking or popping noises in the ear.  Ringing in the ear.  Hearing loss.  Loss of balance.  Dizziness. Symptoms may get worse when the air pressure around you changes, such as when you travel to an area of high elevation, fly on an airplane, or go scuba diving. How is this diagnosed? This condition may be diagnosed based on:  Your symptoms.  A physical exam of your ears, nose, and throat.  Tests, such as those that measure: ? The movement of your eardrum (tympanogram). ? Your hearing (audiometry). How is this treated? Treatment depends on the cause and severity of your condition.  In mild cases, you may relieve your symptoms by moving air into your ears. This is called "popping the ears."  In more severe cases, or if you have symptoms of fluid in your ears, treatment may include: ? Medicines to relieve congestion (decongestants). ? Medicines that treat allergies (antihistamines). ? Nasal sprays or ear drops that contain medicines that reduce swelling (steroids). ? A procedure to drain the fluid in your eardrum (myringotomy). In this procedure, a small tube is placed in the eardrum to:  Drain the fluid.  Restore the air in the middle ear space. ? A procedure to insert a balloon device through the nose  to inflate the opening of the  eustachian tube (balloon dilation). Follow these instructions at home: Lifestyle  Do not do any of the following until your health care provider approves: ? Travel to high altitudes. ? Fly in airplanes. ? Work in a Estate agent or room. ? Scuba dive.  Do not use any products that contain nicotine or tobacco, such as cigarettes and e-cigarettes. If you need help quitting, ask your health care provider.  Keep your ears dry. Wear fitted earplugs during showering and bathing. Dry your ears completely after. General instructions  Take over-the-counter and prescription medicines only as told by your health care provider.  Use techniques to help pop your ears as recommended by your health care provider. These may include: ? Chewing gum. ? Yawning. ? Frequent, forceful swallowing. ? Closing your mouth, holding your nose closed, and gently blowing as if you are trying to blow air out of your nose.  Keep all follow-up visits as told by your health care provider. This is important. Contact a health care provider if:  Your symptoms do not go away after treatment.  Your symptoms come back after treatment.  You are unable to pop your ears.  You have: ? A fever. ? Pain in your ear. ? Pain in your head or neck. ? Fluid draining from your ear.  Your hearing suddenly changes.  You become very dizzy.  You lose your balance. Summary  Eustachian tube dysfunction refers to a condition in which a blockage develops in the eustachian tube.  It can be caused by ear infections, allergies, inhaled irritants, or abnormal growths in the nose or throat.  Symptoms include ear pain, hearing loss, or ringing in the ears.  Mild cases are treated with maneuvers to unblock the ears, such as yawning or ear popping.  Severe cases are treated with medicines. Surgery may also be done (rare). This information is not intended to replace advice given to you by your health care provider. Make sure you  discuss any questions you have with your health care provider. Document Revised: 01/15/2018 Document Reviewed: 01/15/2018 Elsevier Patient Education  2020 [employer] Form - Headache Record There are many types and causes of headaches. A headache record can help guide your treatment plan. Use this form to record the details. Bring this form with you to your follow-up visits. Follow your health care provider's instructions on how to describe your headache. You may be asked to:  Use a pain scale. This is a tool to rate the intensity of your headache using words or numbers.  Describe what your headache feels like, such as dull, achy, throbbing, or sharp. Headache record Date: _______________ Time (from start to end): ____________________ Location of the headache: _________________________  Intensity of the headache: ____________________ Description of the headache: ______________________________________________________________  Hours of sleep the night before the headache: __________  Food or drinks before the headache started: ______________________________________________________________________________________  Events before the headache started: _______________________________________________________________________________________________  Symptoms before the headache started: __________________________________________________________________________________________  Symptoms during the headache: __________________________________________________________________________________________________  Treatment: ________________________________________________________________________________________________________________  Effect of treatment: _________________________________________________________________________________________________________  Other comments: ___________________________________________________________________________________________________________ Date:  _______________ Time (from start to end): ____________________ Location of the headache: _________________________  Intensity of the headache: ____________________ Description of the headache: ______________________________________________________________  Hours of sleep the night before the headache: __________  Food or drinks before the headache started: ______________________________________________________________________________________  Events before the headache started: ____________________________________________________________________________________________  Symptoms before the headache started: _________________________________________________________________________________________  Symptoms during the headache: _______________________________________________________________________________________________  Treatment: ________________________________________________________________________________________________________________  Effect  of treatment: _________________________________________________________________________________________________________  Other comments: ___________________________________________________________________________________________________________ Date: _______________ Time (from start to end): ____________________ Location of the headache: _________________________  Intensity of the headache: ____________________ Description of the headache: ______________________________________________________________  Hours of sleep the night before the headache: __________  Food or drinks before the headache started: ______________________________________________________________________________________  Events before the headache started: ____________________________________________________________________________________________  Symptoms before the headache started:  _________________________________________________________________________________________  Symptoms during the headache: _______________________________________________________________________________________________  Treatment: ________________________________________________________________________________________________________________  Effect of treatment: _________________________________________________________________________________________________________  Other comments: ___________________________________________________________________________________________________________ Date: _______________ Time (from start to end): ____________________ Location of the headache: _________________________  Intensity of the headache: ____________________ Description of the headache: ______________________________________________________________  Hours of sleep the night before the headache: _________  Food or drinks before the headache started: ______________________________________________________________________________________  Events before the headache started: ____________________________________________________________________________________________  Symptoms before the headache started: _________________________________________________________________________________________  Symptoms during the headache: _______________________________________________________________________________________________  Treatment: ________________________________________________________________________________________________________________  Effect of treatment: _________________________________________________________________________________________________________  Other comments: ___________________________________________________________________________________________________________ Date: _______________ Time (from start to end): ____________________ Location of the headache:  _________________________  Intensity of the headache: ____________________ Description of the headache: ______________________________________________________________  Hours of sleep the night before the headache: _________  Food or drinks before the headache started: ______________________________________________________________________________________  Events before the headache started: ____________________________________________________________________________________________  Symptoms before the headache started: _________________________________________________________________________________________  Symptoms during the headache: _______________________________________________________________________________________________  Treatment: ________________________________________________________________________________________________________________  Effect of treatment: _________________________________________________________________________________________________________  Other comments: ___________________________________________________________________________________________________________ This information is not intended to replace advice given to you by your health care provider. Make sure you discuss any questions you have with your health care provider. Document Revised: 10/14/2018 Document Reviewed: 10/14/2018 Elsevier Patient Education  2020 [employer]. Otitis Media, Adult  Otitis media means that the middle ear is red and swollen (inflamed) and full of fluid. The condition usually goes away on its own. Follow these instructions at home:  Take over-the-counter and prescription medicines only as told by your doctor.  If you were prescribed an antibiotic medicine, take it as told by your doctor. Do not stop taking the antibiotic even if you start to feel better.  Keep all follow-up visits as told by your doctor. This is important. Contact a doctor if:  You have  bleeding from your nose.  There is a lump on your neck.  You are not getting better in 5 days.  You feel worse instead of better. Get help right away if:  You have pain that is not helped with medicine.  You have swelling, redness, or pain around your ear.  You get a stiff neck.  You cannot move part of your face (paralyzed).  You notice that the bone behind your ear hurts when you touch it.  You get a very bad headache. Summary  Otitis media means that the middle ear is red, swollen, and full of fluid.  This condition usually goes away on its own. In some cases, treatment may be needed.  If you were prescribed an antibiotic medicine, take it as told by your doctor. This information is not intended to replace advice given to you by your health care provider. Make sure you discuss any questions you have with your health care provider. Document Revised: 09/07/2017 Document Reviewed: 10/16/2016 Elsevier Patient Education  2020 Else[employer] Inc. Fluticasone nasal spray What is this medicine? FLUTICASONE (floo TIK a sone) is a corticosteroid. This medicine is used to treat the symptoms of allergies like sneezing, itchy red eyes, and itchy, runny, or stuffy nose. This medicine is also used to treat nasal polyps. This medicine may be used for other  purposes; ask your health care provider or pharmacist if you have questions. COMMON BRAND NAME(S): ClariSpray, Flonase, Flonase Allergy Relief, Flonase Sensimist, Veramyst, XHANCE What should I tell my health care provider before I take this medicine? They need to know if you have any of these conditions:  eye disease, vision problems  infection, like tuberculosis, herpes, or fungal infection  recent surgery on nose or sinuses  taking a corticosteroid by mouth  an unusual or allergic reaction to fluticasone, steroids, other medicines, foods, dyes, or preservatives  pregnant or trying to get pregnant  breast-feeding How should I use  this medicine? This medicine is for use in the nose. Follow the directions on your product or prescription label. This medicine works best if used at regular intervals. Do not use more often than directed. Make sure that you are using your nasal spray correctly. After 6 months of daily use for allergies, talk to your doctor or health care professional before using it for a longer time. Ask your doctor or health care professional if you have any questions. Talk to your pediatrician regarding the use of this medicine in children. Special care may be needed. Some products have been used for allergies in children as young as 2 years. After 2 months of daily use without a prescription in a child, talk to your pediatrician before using it for a longer time. Use of this medicine for nasal polyps is not approved in children. Overdosage: If you think you have taken too much of this medicine contact a poison control center or emergency room at once. NOTE: This medicine is only for you. Do not share this medicine with others. What if I miss a dose? If you miss a dose, use it as soon as you remember. If it is almost time for your next dose, use only that dose and continue with your regular schedule. Do not use double or extra doses. What may interact with this medicine?  certain antibiotics like clarithromycin and telithromycin  certain medicines for fungal infections like ketoconazole, itraconazole, and voriconazole  conivaptan  nefazodone  some medicines for HIV  vaccines This list may not describe all possible interactions. Give your health care provider a list of all the medicines, herbs, non-prescription drugs, or dietary supplements you use. Also tell them if you smoke, drink alcohol, or use illegal drugs. Some items may interact with your medicine. What should I watch for while using this medicine? Visit your healthcare professional for regular checks on your progress. Tell your healthcare  professional if your symptoms do not start to get better or if they get worse. This medicine may increase your risk of getting an infection. Tell your doctor or health care professional if you are around anyone with measles or chickenpox, or if you develop sores or blisters that do not heal properly. What side effects may I notice from receiving this medicine? Side effects that you should report to your doctor or health care professional as soon as possible:  allergic reactions like skin rash, itching or hives, swelling of the face, lips, or tongue  changes in vision  crusting or sores in the nose  nosebleed  signs and symptoms of infection like fever or chills; cough; sore throat  white patches or sores in the mouth or nose Side effects that usually do not require medical attention (report to your doctor or health care professional if they continue or are bothersome):  burning or irritation inside the nose or throat  changes in  taste or smell  cough  headache This list may not describe all possible side effects. Call your doctor for medical advice about side effects. You may report side effects to FDA at 1-800-FDA-1088. Where should I keep my medicine? Keep out of the reach of children. Store at room temperature between 15 and 30 degrees C (59 and 86 degrees F). Avoid exposure to extreme heat, cold, or light. Throw away any unused medicine after the expiration date. NOTE: This sheet is a summary. It may not cover all possible information. If you have questions about this medicine, talk to your doctor, pharmacist, or health care provider.  2020 Elsevier/Gold Standard (2017-10-18 14:10:08) Eustachian Tube Dysfunction  Eustachian tube dysfunction refers to a condition in which a blockage develops in the narrow passage that connects the middle ear to the back of the nose (eustachian tube). The eustachian tube regulates air pressure in the middle ear by letting air move between the ear  and nose. It also helps to drain fluid from the middle ear space. Eustachian tube dysfunction can affect one or both ears. When the eustachian tube does not function properly, air pressure, fluid, or both can build up in the middle ear. What are the causes? This condition occurs when the eustachian tube becomes blocked or cannot open normally. Common causes of this condition include:  Ear infections.  Colds and other infections that affect the nose, mouth, and throat (upper respiratory tract).  Allergies.  Irritation from cigarette smoke.  Irritation from stomach acid coming up into the esophagus (gastroesophageal reflux). The esophagus is the tube that carries food from the mouth to the stomach.  Sudden changes in air pressure, such as from descending in an airplane or scuba diving.  Abnormal growths in the nose or throat, such as: ? Growths that line the nose (nasal polyps). ? Abnormal growth of cells (tumors). ? Enlarged tissue at the back of the throat (adenoids). What increases the risk? You are more likely to develop this condition if:  You smoke.  You are overweight.  You are a child who has: ? Certain birth defects of the mouth, such as cleft palate. ? Large tonsils or adenoids. What are the signs or symptoms? Common symptoms of this condition include:  A feeling of fullness in the ear.  Ear pain.  Clicking or popping noises in the ear.  Ringing in the ear.  Hearing loss.  Loss of balance.  Dizziness. Symptoms may get worse when the air pressure around you changes, such as when you travel to an area of high elevation, fly on an airplane, or go scuba diving. How is this diagnosed? This condition may be diagnosed based on:  Your symptoms.  A physical exam of your ears, nose, and throat.  Tests, such as those that measure: ? The movement of your eardrum (tympanogram). ? Your hearing (audiometry). How is this treated? Treatment depends on the cause and  severity of your condition.  In mild cases, you may relieve your symptoms by moving air into your ears. This is called "popping the ears."  In more severe cases, or if you have symptoms of fluid in your ears, treatment may include: ? Medicines to relieve congestion (decongestants). ? Medicines that treat allergies (antihistamines). ? Nasal sprays or ear drops that contain medicines that reduce swelling (steroids). ? A procedure to drain the fluid in your eardrum (myringotomy). In this procedure, a small tube is placed in the eardrum to:  Drain the fluid.  Restore the  air in the middle ear space. ? A procedure to insert a balloon device through the nose to inflate the opening of the eustachian tube (balloon dilation). Follow these instructions at home: Lifestyle  Do not do any of the following until your health care provider approves: ? Travel to high altitudes. ? Fly in airplanes. ? Work in a Estate agent or room. ? Scuba dive.  Do not use any products that contain nicotine or tobacco, such as cigarettes and e-cigarettes. If you need help quitting, ask your health care provider.  Keep your ears dry. Wear fitted earplugs during showering and bathing. Dry your ears completely after. General instructions  Take over-the-counter and prescription medicines only as told by your health care provider.  Use techniques to help pop your ears as recommended by your health care provider. These may include: ? Chewing gum. ? Yawning. ? Frequent, forceful swallowing. ? Closing your mouth, holding your nose closed, and gently blowing as if you are trying to blow air out of your nose.  Keep all follow-up visits as told by your health care provider. This is important. Contact a health care provider if:  Your symptoms do not go away after treatment.  Your symptoms come back after treatment.  You are unable to pop your ears.  You have: ? A fever. ? Pain in your ear. ? Pain in your head  or neck. ? Fluid draining from your ear.  Your hearing suddenly changes.  You become very dizzy.  You lose your balance. Summary  Eustachian tube dysfunction refers to a condition in which a blockage develops in the eustachian tube.  It can be caused by ear infections, allergies, inhaled irritants, or abnormal growths in the nose or throat.  Symptoms include ear pain, hearing loss, or ringing in the ears.  Mild cases are treated with maneuvers to unblock the ears, such as yawning or ear popping.  Severe cases are treated with medicines. Surgery may also be done (rare). This information is not intended to replace advice given to you by your health care provider. Make sure you discuss any questions you have with your health care provider. Document Revised: 01/15/2018 Document Reviewed: 01/15/2018 Elsevier Patient Education  2020 Elsevier Inc.  Eustachian Tube Dysfunction  Eustachian tube dysfunction refers to a condition in which a blockage develops in the narrow passage that connects the middle ear to the back of the nose (eustachian tube). The eustachian tube regulates air pressure in the middle ear by letting air move between the ear and nose. It also helps to drain fluid from the middle ear space. Eustachian tube dysfunction can affect one or both ears. When the eustachian tube does not function properly, air pressure, fluid, or both can build up in the middle ear. What are the causes? This condition occurs when the eustachian tube becomes blocked or cannot open normally. Common causes of this condition include:  Ear infections.  Colds and other infections that affect the nose, mouth, and throat (upper respiratory tract).  Allergies.  Irritation from cigarette smoke.  Irritation from stomach acid coming up into the esophagus (gastroesophageal reflux). The esophagus is the tube that carries food from the mouth to the stomach.  Sudden changes in air pressure, such as from  descending in an airplane or scuba diving.  Abnormal growths in the nose or throat, such as: ? Growths that line the nose (nasal polyps). ? Abnormal growth of cells (tumors). ? Enlarged tissue at the back of the throat (adenoids).  What increases the risk? You are more likely to develop this condition if:  You smoke.  You are overweight.  You are a child who has: ? Certain birth defects of the mouth, such as cleft palate. ? Large tonsils or adenoids. What are the signs or symptoms? Common symptoms of this condition include:  A feeling of fullness in the ear.  Ear pain.  Clicking or popping noises in the ear.  Ringing in the ear.  Hearing loss.  Loss of balance.  Dizziness. Symptoms may get worse when the air pressure around you changes, such as when you travel to an area of high elevation, fly on an airplane, or go scuba diving. How is this diagnosed? This condition may be diagnosed based on:  Your symptoms.  A physical exam of your ears, nose, and throat.  Tests, such as those that measure: ? The movement of your eardrum (tympanogram). ? Your hearing (audiometry). How is this treated? Treatment depends on the cause and severity of your condition.  In mild cases, you may relieve your symptoms by moving air into your ears. This is called "popping the ears."  In more severe cases, or if you have symptoms of fluid in your ears, treatment may include: ? Medicines to relieve congestion (decongestants). ? Medicines that treat allergies (antihistamines). ? Nasal sprays or ear drops that contain medicines that reduce swelling (steroids). ? A procedure to drain the fluid in your eardrum (myringotomy). In this procedure, a small tube is placed in the eardrum to:  Drain the fluid.  Restore the air in the middle ear space. ? A procedure to insert a balloon device through the nose to inflate the opening of the eustachian tube (balloon dilation). Follow these instructions  at home: Lifestyle  Do not do any of the following until your health care provider approves: ? Travel to high altitudes. ? Fly in airplanes. ? Work in a Estate agent or room. ? Scuba dive.  Do not use any products that contain nicotine or tobacco, such as cigarettes and e-cigarettes. If you need help quitting, ask your health care provider.  Keep your ears dry. Wear fitted earplugs during showering and bathing. Dry your ears completely after. General instructions  Take over-the-counter and prescription medicines only as told by your health care provider.  Use techniques to help pop your ears as recommended by your health care provider. These may include: ? Chewing gum. ? Yawning. ? Frequent, forceful swallowing. ? Closing your mouth, holding your nose closed, and gently blowing as if you are trying to blow air out of your nose.  Keep all follow-up visits as told by your health care provider. This is important. Contact a health care provider if:  Your symptoms do not go away after treatment.  Your symptoms come back after treatment.  You are unable to pop your ears.  You have: ? A fever. ? Pain in your ear. ? Pain in your head or neck. ? Fluid draining from your ear.  Your hearing suddenly changes.  You become very dizzy.  You lose your balance. Summary  Eustachian tube dysfunction refers to a condition in which a blockage develops in the eustachian tube.  It can be caused by ear infections, allergies, inhaled irritants, or abnormal growths in the nose or throat.  Symptoms include ear pain, hearing loss, or ringing in the ears.  Mild cases are treated with maneuvers to unblock the ears, such as yawning or ear popping.  Severe cases are treated  with medicines. Surgery may also be done (rare). This information is not intended to replace advice given to you by your health care provider. Make sure you discuss any questions you have with your health care  provider. Document Revised: 01/15/2018 Document Reviewed: 01/15/2018 Elsevier Patient Education  2020 [employer] Inc[employer]  Hip Pain The hip is the joint between the upper legs and the lower pelvis. The bones, cartilage, tendons, and muscles of your hip joint support your body and allow you to move around. Hip pain can range from a minor ache to severe pain in one or both of your hips. The pain may be felt on the inside of the hip joint near the groin, or on the outside near the buttocks and upper thigh. You may also have swelling or stiffness in your hip area. Follow these instructions at home: Managing pain, stiffness, and swelling      If directed, put ice on the painful area. To do this: ? Put ice in a plastic bag. ? Place a towel between your skin and the bag. ? Leave the ice on for 20 minutes, 2-3 times a day.  If directed, apply heat to the affected area as often as told by your health care provider. Use the heat source that your health care provider recommends, such as a moist heat pack or a heating pad. ? Place a towel between your skin and the heat source. ? Leave the heat on for 20-30 minutes. ? Remove the heat if your skin turns bright red. This is especially important if you are unable to feel pain, heat, or cold. You may have a greater risk of getting burned. Activity  Do exercises as told by your health care provider.  Avoid activities that cause pain. General instructions   Take over-the-counter and prescription medicines only as told by your health care provider.  Keep a journal of your symptoms. Write down: ? How often you have hip pain. ? The location of your pain. ? What the pain feels like. ? What makes the pain worse.  Sleep with a pillow between your legs on your most comfortable side.  Keep all follow-up visits as told by your health care provider. This is important. Contact a health care provider if:  You cannot put weight on your leg.  Your pain or  swelling continues or gets worse after one week.  It gets harder to walk.  You have a fever. Get help right away if:  You fall.  You have a sudden increase in pain and swelling in your hip.  Your hip is red or swollen or very tender to touch. Summary  Hip pain can range from a minor ache to severe pain in one or both of your hips.  The pain may be felt on the inside of the hip joint near the groin, or on the outside near the buttocks and upper thigh.  Avoid activities that cause pain.  Write down how often you have hip pain, the location of the pain, what makes it worse, and what it feels like. This information is not intended to replace advice given to you by your health care provider. Make sure you discuss any questions you have with your health care provider. Document Revised: 02/10/2019 Document Reviewed: 02/10/2019 Elsevier Patient Education  2020 [employer].

## 2020-04-13 DIAGNOSIS — H6983 Other specified disorders of Eustachian tube, bilateral: Secondary | ICD-10-CM | POA: Insufficient documentation

## 2020-04-13 DIAGNOSIS — M25552 Pain in left hip: Secondary | ICD-10-CM | POA: Insufficient documentation

## 2020-04-13 DIAGNOSIS — J0110 Acute frontal sinusitis, unspecified: Secondary | ICD-10-CM | POA: Insufficient documentation

## 2020-04-13 DIAGNOSIS — G44209 Tension-type headache, unspecified, not intractable: Secondary | ICD-10-CM | POA: Insufficient documentation

## 2020-04-13 DIAGNOSIS — N289 Disorder of kidney and ureter, unspecified: Secondary | ICD-10-CM | POA: Insufficient documentation

## 2020-04-13 DIAGNOSIS — H6501 Acute serous otitis media, right ear: Secondary | ICD-10-CM | POA: Insufficient documentation

## 2020-04-26 ENCOUNTER — Ambulatory Visit
Admission: RE | Admit: 2020-04-26 | Discharge: 2020-04-26 | Disposition: A | Discharge status: Home / Self Care | Payer: Managed Care, Other (non HMO) | Attending: Adult Health | Admitting: Adult Health

## 2020-04-26 ENCOUNTER — Ambulatory Visit
Admission: RE | Admit: 2020-04-26 | Discharge: 2020-04-26 | Disposition: A | Discharge status: Home / Self Care | Payer: Managed Care, Other (non HMO) | Source: Ambulatory Visit | Attending: Adult Health | Admitting: Adult Health

## 2020-04-26 DIAGNOSIS — M25552 Pain in left hip: Secondary | ICD-10-CM | POA: Insufficient documentation

## 2020-04-28 NOTE — Progress Notes (Signed)
Left hip x Nancy Randolph is negative. If symptoms persist recommend orthopedics referral.

## 2020-05-01 ENCOUNTER — Other Ambulatory Visit: Payer: Self-pay | Admitting: Adult Health

## 2020-05-01 NOTE — Telephone Encounter (Signed)
Requested Prescriptions  Pending Prescriptions Disp Refills  . fluticasone (FLONASE) 50 MCG/ACT nasal spray [Pharmacy Med Name: FLUTICASONE PROP 50 MCG SPRAY] 16 mL 0    Sig: SPRAY 2 SPRAYS INTO EACH NOSTRIL EVERY DAY     Ear, Nose, and Throat: Nasal Preparations - Corticosteroids Passed - 05/01/2020  1:32 PM      Passed - Valid encounter within last 12 months    Recent Outpatient Visits          3 weeks ago Tension-type headache, not intractable, unspecified chronicity pattern   [facility] Flinchum, Eula Fried, FNP   2 months ago Hair loss   [employer] [facility], Eula Fried, FNP      Future Appointments            In 2 days Flinchum, Eula Fried, FNP [facility]   In 1 month Flinchum, Eula Fried, FNP [facility]

## 2020-05-03 ENCOUNTER — Ambulatory Visit: Payer: Managed Care, Other (non HMO) | Admitting: Adult Health

## 2020-05-03 ENCOUNTER — Other Ambulatory Visit: Payer: Self-pay | Admitting: Adult Health

## 2020-05-03 ENCOUNTER — Encounter: Payer: Self-pay | Admitting: Adult Health

## 2020-05-03 ENCOUNTER — Other Ambulatory Visit: Payer: Self-pay

## 2020-05-03 VITALS — BP 138/78 | HR 74 | Temp 96.0°F | Resp 15 | Wt 202.4 lb

## 2020-05-03 DIAGNOSIS — Z6832 Body mass index (BMI) 32.0-32.9, adult: Secondary | ICD-10-CM

## 2020-05-03 DIAGNOSIS — M25552 Pain in left hip: Secondary | ICD-10-CM

## 2020-05-03 DIAGNOSIS — J0110 Acute frontal sinusitis, unspecified: Secondary | ICD-10-CM

## 2020-05-03 NOTE — Progress Notes (Signed)
Established patient visit   Patient: Nancy Randolph   DOB: 20-Dec-1965   54 y.o. Female  MRN: [id number] Visit Date: 05/03/2020  Today's healthcare provider: Jairo Ben, FNP   Chief Complaint  Patient presents with  . Follow-up   Subjective    HPI  Follow up for tension headache  The patient was last seen for this 3 weeks ago. Changes made at last visit include patient was treated for sinusitis and was prescribed Flonase, Augmentin and Zyrtec.She takes Flonase and zyrtec.  She has nasal fullness. No drainage colored.  Feels better after the Augmentin.   She reports excellent compliance with treatment. She feels that condition is Improved. She is not having side effects.   Colonoscopy - 5 years ago. Was normal due again in 5 years. Denies any bleeding.  Denies any abdominal pain.  She does have some constipation.  She has tried Metamucil reports it does not really help her that much.  She does have regular bowel movements, denies any blood or tarry stools.  Left hip pain x 1 year intermittent .hurting everyday now.   Normal xray.  No improvement with NSAIDs.  She wants to orthopedics.   Patient  denies any fever, body aches,chills, rash, chest pain, shortness of breath, nausea, vomiting, or diarrhea.  Denies dizziness, lightheadedness, pre syncopal or syncopal episodes.   -----------------------------------------------------------------------------------------      Medications: Outpatient Medications Prior to Visit  Medication Sig Note  . calcium elemental as carbonate (BARIATRIC TUMS ULTRA) 400 MG chewable tablet Chew 1,000 mg by mouth daily.   . Cholecalciferol (VITAMIN D3) 50 MCG (2000 UT) TABS Take 50 mcg by mouth daily.   . cyanocobalamin 1000 MCG tablet Take 1,000 mcg by mouth daily.   Marland Kitchen ketoconazole (NIZORAL) 2 % shampoo Apply 1 application topically 2 (two) times a week. Apply to area right heel apply 2 x week for 6 weeks. Lather and rinse after  10 minutes.   . NON FORMULARY Take 500 mg by mouth daily. Beet Root   . TURMERIC CURCUMIN PO Take 100 mg by mouth daily.   . cetirizine (ZYRTEC ALLERGY) 10 MG tablet Take 1 tablet (10 mg total) by mouth daily. (Patient not taking: Reported on 05/03/2020) 05/03/2020: PRN  . esomeprazole (NEXIUM) 20 MG capsule Take 20 mg by mouth as needed. (Patient not taking: Reported on 05/03/2020) 05/03/2020: PRN  . fluticasone (FLONASE) 50 MCG/ACT nasal spray SPRAY 2 SPRAYS INTO EACH NOSTRIL EVERY DAY (Patient not taking: Reported on 05/03/2020) 05/03/2020: PRN  . [DISCONTINUED] amoxicillin-clavulanate (AUGMENTIN) 875-125 MG tablet Take 1 tablet by mouth 2 (two) times daily.    No facility-administered medications prior to visit.    Review of Systems    Objective    BP (!) 138/78   Pulse 74   Temp (!) 96 F (35.6 C) (Oral)   Resp 15   Wt 202 lb 6.4 oz (91.8 kg)   SpO2 97%   BMI 32.67 kg/m    Physical Exam Vitals reviewed.  Constitutional:      General: She is not in acute distress.    Appearance: She is well-developed. She is not diaphoretic.     Interventions: She is not intubated. HENT:     Head: Normocephalic and atraumatic.     Right Ear: External ear normal. No drainage. No middle ear effusion. There is no impacted cerumen. Tympanic membrane is not erythematous.     Left Ear: External ear normal. No drainage.  No middle ear  effusion. There is no impacted cerumen. Tympanic membrane is not erythematous.     Nose: Rhinorrhea present. No nasal tenderness, mucosal edema or congestion.     Right Turbinates: Not swollen or pale.     Left Turbinates: Not swollen or pale.     Right Sinus: No maxillary sinus tenderness or frontal sinus tenderness.     Left Sinus: No maxillary sinus tenderness or frontal sinus tenderness.     Mouth/Throat:     Lips: Pink.     Mouth: Mucous membranes are moist.     Pharynx: Oropharynx is clear. Uvula midline. No oropharyngeal exudate.  Eyes:     General: Lids are  normal. No scleral icterus.       Right eye: No discharge.        Left eye: No discharge.     Conjunctiva/sclera: Conjunctivae normal.     Right eye: Right conjunctiva is not injected. No exudate or hemorrhage.    Left eye: Left conjunctiva is not injected. No exudate or hemorrhage.    Pupils: Pupils are equal, round, and reactive to light.  Neck:     Thyroid: No thyroid mass or thyromegaly.     Vascular: Normal carotid pulses. No carotid bruit, hepatojugular reflux or JVD.     Trachea: Trachea and phonation normal. No tracheal tenderness or tracheal deviation.     Meningeal: Brudzinski's sign and Kernig's sign absent.  Cardiovascular:     Rate and Rhythm: Normal rate and regular rhythm.     Pulses: Normal pulses.          Radial pulses are 2+ on the right side and 2+ on the left side.       Dorsalis pedis pulses are 2+ on the right side and 2+ on the left side.       Posterior tibial pulses are 2+ on the right side and 2+ on the left side.     Heart sounds: Normal heart sounds, S1 normal and S2 normal. Heart sounds not distant. No murmur heard.  No friction rub. No gallop.   Pulmonary:     Effort: Pulmonary effort is normal. No tachypnea, bradypnea, accessory muscle usage or respiratory distress. She is not intubated.     Breath sounds: Normal breath sounds. No stridor. No wheezing, rhonchi or rales.  Chest:     Chest wall: No tenderness.  Abdominal:     General: Bowel sounds are normal. There is no distension or abdominal bruit.     Palpations: Abdomen is soft. There is no shifting dullness, fluid wave, hepatomegaly, splenomegaly, mass or pulsatile mass.     Tenderness: There is no abdominal tenderness. There is no right CVA tenderness, left CVA tenderness, guarding or rebound.     Hernia: No hernia is present.  Musculoskeletal:        General: Tenderness (left hip ) present. No deformity. Normal range of motion.     Cervical back: Full passive range of motion without pain, normal  range of motion and neck supple. No edema, erythema or rigidity. No spinous process tenderness or muscular tenderness. Normal range of motion.  Lymphadenopathy:     Head:     Right side of head: No submental, submandibular, tonsillar, preauricular, posterior auricular or occipital adenopathy.     Left side of head: No submental, submandibular, tonsillar, preauricular, posterior auricular or occipital adenopathy.     Cervical: No cervical adenopathy.     Right cervical: No superficial, deep or posterior cervical adenopathy.  Left cervical: No superficial, deep or posterior cervical adenopathy.     Upper Body:     Right upper body: No supraclavicular or pectoral adenopathy.     Left upper body: No supraclavicular or pectoral adenopathy.  Skin:    General: Skin is warm and dry.     Coloration: Skin is not pale.     Findings: No abrasion, bruising, burn, ecchymosis, erythema, lesion, petechiae or rash.     Nails: There is no clubbing.  Neurological:     Mental Status: She is alert and oriented to person, place, and time.     GCS: GCS eye subscore is 4. GCS verbal subscore is 5. GCS motor subscore is 6.     Cranial Nerves: No cranial nerve deficit.     Sensory: No sensory deficit.     Motor: No tremor, atrophy, abnormal muscle tone or seizure activity.     Coordination: Coordination normal.     Gait: Gait normal.     Deep Tendon Reflexes: Reflexes are normal and symmetric. Reflexes normal. Babinski sign absent on the right side. Babinski sign absent on the left side.     Reflex Scores:      Tricep reflexes are 2+ on the right side and 2+ on the left side.      Bicep reflexes are 2+ on the right side and 2+ on the left side.      Brachioradialis reflexes are 2+ on the right side and 2+ on the left side.      Patellar reflexes are 2+ on the right side and 2+ on the left side.      Achilles reflexes are 2+ on the right side and 2+ on the left side. Psychiatric:        Speech: Speech normal.         Behavior: Behavior normal.        Thought Content: Thought content normal.        Judgment: Judgment normal.      No results found for any visits on 05/03/20.  Assessment & Plan     Left hip pain - Plan: Ambulatory referral to Orthopedic Surgery  Acute non-recurrent frontal sinusitis  Body mass index (BMI) of 32.0-32.9 in adult  Recommend continuing Zyrtec and Flonase for nasal allergies. Saline per package instructions is okay. Given hip pain has been chronic left hip will refer to orthopedics, she has no other associated signs or symptoms no radiculopathy.  She reports her hip on the left does hurt for over a year denied any injury.  X-Mishkin done at last visit was within normal limits.  She will call the office if not heard from her referral within 2 weeks.  Was given information on Shingrix vaccine at last visit and VIS sheet and has reviewed.  She prefers to hold off on the shingles vaccine at this time and will let us know if she would like to schedule a nurse visit to receive.   Advised patient call the office or your primary care doctor for an appointment if no improvement within 72 hours or if any symptoms change or worsen at any time  Advised ER or urgent Care if after hours or on weekend. Call 911 for emergency symptoms at any time.Patinet verbalized understanding of all instructions given/reviewed and treatment plan and has no further questions or concerns at this time.    The entirety of the information documented in the History of Present Illness, Review of Systems and Physical Exam  were personally obtained by me. Portions of this information were initially documented by the  Certified Medical Assistant whose name is documented in Epic and reviewed by me for thoroughness and accuracy.  I have personally performed the exam and reviewed the chart and it is accurate to the best of my knowledge.  Museum/gallery conservatorDragon technology has been used and any errors in dictation or transcription  are unintentional.  Eula FriedMichelle S. Lismary Kiehn FNP-C  [facility]   Return if symptoms worsen or fail to improve, for at any time for any worsening symptoms, Go to Emergency room/ urgent care if worse.      IBeverely Pace, Kyeisha Janowicz Smith Acel Natzke, FNP, have reviewed all documentation for this visit. The documentation on 05/03/20 for the exam, diagnosis, procedures, and orders are all accurate and complete.    Jairo BenMichelle Smith Caralyn Twining, FNP  [facility] [phone] (phone) [phone] (fax)  [facility]

## 2020-05-03 NOTE — Patient Instructions (Addendum)
Allergic Rhinitis, Adult Allergic rhinitis is a reaction to allergens in the air. Allergens are tiny specks (particles) in the air that cause your body to have an allergic reaction. This condition cannot be passed from person to person (is not contagious). Allergic rhinitis cannot be cured, but it can be controlled. There are two types of allergic rhinitis:  Seasonal. This type is also called hay fever. It happens only during certain times of the year.  Perennial. This type can happen at any time of the year. What are the causes? This condition may be caused by:  Pollen from grasses, trees, and weeds.  House dust mites.  Pet dander.  Mold. What are the signs or symptoms? Symptoms of this condition include:  Sneezing.  Runny or stuffy nose (nasal congestion).  A lot of mucus in the back of the throat (postnasal drip).  Itchy nose.  Tearing of the eyes.  Trouble sleeping.  Being sleepy during day. How is this treated? There is no cure for this condition. You should avoid things that trigger your symptoms (allergens). Treatment can help to relieve symptoms. This may include:  Medicines that block allergy symptoms, such as antihistamines. These may be given as a shot, nasal spray, or pill.  Shots that are given until your body becomes less sensitive to the allergen (desensitization).  Stronger medicines, if all other treatments have not worked. Follow these instructions at home: Avoiding allergens   Find out what you are allergic to. Common allergens include smoke, dust, and pollen.  Avoid them if you can. These are some of the things that you can do to avoid allergens: ? Replace carpet with wood, tile, or vinyl flooring. Carpet can trap dander and dust. ? Clean any mold found in the home. ? Do not smoke. Do not allow smoking in your home. ? Change your heating and air conditioning filter at least once a month. ? During allergy season:  Keep windows closed as much as  you can. If possible, use air conditioning when there is a lot of pollen in the air.  Use a special filter for allergies with your furnace and air conditioner.  Plan outdoor activities when pollen counts are lowest. This is usually during the early morning or evening hours.  If you do go outdoors when pollen count is high, wear a special mask for people with allergies.  When you come indoors, take a shower and change your clothes before sitting on furniture or bedding. General instructions  Do not use fans in your home.  Do not hang clothes outside to dry.  Wear sunglasses to keep pollen out of your eyes.  Wash your hands right away after you touch household pets.  Take over-the-counter and prescription medicines only as told by your doctor.  Keep all follow-up visits as told by your doctor. This is important. Contact a doctor if:  You have a fever.  You have a cough that does not go away (is persistent).  You start to make whistling sounds when you breathe (wheeze).  Your symptoms do not get better with treatment.  You have thick fluid coming from your nose.  You start to have nosebleeds. Get help right away if:  Your tongue or your lips are swollen.  You have trouble breathing.  You feel dizzy or you feel like you are going to pass out (faint).  You have cold sweats. Summary  Allergic rhinitis is a reaction to allergens in the air.  This condition may be   caused by allergens. These include pollen, dust mites, pet dander, and mold.  Symptoms include a runny, itchy nose, sneezing, or tearing eyes. You may also have trouble sleeping or feel sleepy during the day.  Treatment includes taking medicines and avoiding allergens. You may also get shots or take stronger medicines.  Get help if you have a fever or a cough that does not stop. Get help right away if you are short of breath. This information is not intended to replace advice given to you by your health care  provider. Make sure you discuss any questions you have with your health care provider. Document Revised: 01/14/2019 Document Reviewed: 04/16/2018 Elsevier Patient Education  2020 Elsevier Inc.   Fat and Cholesterol Restricted Eating Plan Getting too much fat and cholesterol in your diet may cause health problems. Choosing the right foods helps keep your fat and cholesterol at normal levels. This can keep you from getting certain diseases. Your doctor may recommend an eating plan that includes:  Total fat: ______% or less of total calories a day.  Saturated fat: ______% or less of total calories a day.  Cholesterol: less than _________mg a day.  Fiber: ______g a day. What are tips for following this plan? Meal planning  At meals, divide your plate into four equal parts: ? Fill one-half of your plate with vegetables and green salads. ? Fill one-fourth of your plate with whole grains. ? Fill one-fourth of your plate with low-fat (lean) protein foods.  Eat fish that is high in omega-3 fats at least two times a week. This includes mackerel, tuna, sardines, and salmon.  Eat foods that are high in fiber, such as whole grains, beans, apples, broccoli, carrots, peas, and barley. General tips   Work with your doctor to lose weight if you need to.  Avoid: ? Foods with added sugar. ? Fried foods. ? Foods with partially hydrogenated oils.  Limit alcohol intake to no more than 1 drink a day for nonpregnant women and 2 drinks a day for men. One drink equals 12 oz of beer, 5 oz of wine, or 1 oz of hard liquor. Reading food labels  Check food labels for: ? Trans fats. ? Partially hydrogenated oils. ? Saturated fat (g) in each serving. ? Cholesterol (mg) in each serving. ? Fiber (g) in each serving.  Choose foods with healthy fats, such as: ? Monounsaturated fats. ? Polyunsaturated fats. ? Omega-3 fats.  Choose grain products that have whole grains. Look for the word "whole" as  the first word in the ingredient list. Cooking  Cook foods using low-fat methods. These include baking, boiling, grilling, and broiling.  Eat more home-cooked foods. Eat at restaurants and buffets less often.  Avoid cooking using saturated fats, such as butter, cream, palm oil, palm kernel oil, and coconut oil. Recommended foods  Fruits  All fresh, canned (in natural juice), or frozen fruits. Vegetables  Fresh or frozen vegetables (raw, steamed, roasted, or grilled). Green salads. Grains  Whole grains, such as whole wheat or whole grain breads, crackers, cereals, and pasta. Unsweetened oatmeal, bulgur, barley, quinoa, or brown rice. Corn or whole wheat flour tortillas. Meats and other protein foods  Ground beef (85% or leaner), grass-fed beef, or beef trimmed of fat. Skinless chicken or [place]. Ground chicken or [place]. Pork trimmed of fat. All fish and seafood. Egg whites. Dried beans, peas, or lentils. Unsalted nuts or seeds. Unsalted canned beans. Nut butters without added sugar or oil. Dairy  Low-fat or nonfat  dairy products, such as skim or 1% milk, 2% or reduced-fat cheeses, low-fat and fat-free ricotta or cottage cheese, or plain low-fat and nonfat yogurt. Fats and oils  Tub margarine without trans fats. Light or reduced-fat mayonnaise and salad dressings. Avocado. Olive, canola, sesame, or safflower oils. The items listed above may not be a complete list of foods and beverages you can eat. Contact a dietitian for more information. Foods to avoid Fruits  Canned fruit in heavy syrup. Fruit in cream or butter sauce. Fried fruit. Vegetables  Vegetables cooked in cheese, cream, or butter sauce. Fried vegetables. Grains  White bread. White pasta. White rice. Cornbread. Bagels, pastries, and croissants. Crackers and snack foods that contain trans fat and hydrogenated oils. Meats and other protein foods  Fatty cuts of meat. Ribs, chicken wings, bacon, sausage, bologna,  salami, chitterlings, fatback, hot dogs, bratwurst, and packaged lunch meats. Liver and organ meats. Whole eggs and egg yolks. Chicken and [place] with skin. Fried meat. Dairy  Whole or 2% milk, cream, half-and-half, and cream cheese. Whole milk cheeses. Whole-fat or sweetened yogurt. Full-fat cheeses. Nondairy creamers and whipped toppings. Processed cheese, cheese spreads, and cheese curds. Beverages  Alcohol. Sugar-sweetened drinks such as sodas, lemonade, and fruit drinks. Fats and oils  Butter, stick margarine, lard, shortening, ghee, or bacon fat. Coconut, palm kernel, and palm oils. Sweets and desserts  Corn syrup, sugars, honey, and molasses. Candy. Jam and jelly. Syrup. Sweetened cereals. Cookies, pies, cakes, donuts, muffins, and ice cream. The items listed above may not be a complete list of foods and beverages you should avoid. Contact a dietitian for more information. Summary  Choosing the right foods helps keep your fat and cholesterol at normal levels. This can keep you from getting certain diseases.  At meals, fill one-half of your plate with vegetables and green salads.  Eat high-fiber foods, like whole grains, beans, apples, carrots, peas, and barley.  Limit added sugar, saturated fats, alcohol, and fried foods. This information is not intended to replace advice given to you by your health care provider. Make sure you discuss any questions you have with your health care provider. Document Revised: 05/29/2018 Document Reviewed: 06/12/2017 Elsevier Patient Education  2020 [employer].

## 2020-05-04 ENCOUNTER — Telehealth: Payer: Self-pay

## 2020-05-04 LAB — COMPREHENSIVE METABOLIC PANEL
ALT: 13 IU/L (ref 0–32)
AST: 18 IU/L (ref 0–40)
Albumin/Globulin Ratio: 1.7 (ref 1.2–2.2)
Albumin: 4.2 g/dL (ref 3.8–4.9)
Alkaline Phosphatase: 75 IU/L (ref 48–121)
BUN/Creatinine Ratio: 13 (ref 9–23)
BUN: 14 mg/dL (ref 6–24)
Bilirubin Total: <0.2 mg/dL (ref 0.0–1.2)
CO2: 25 mmol/L (ref 20–29)
Calcium: 9.1 mg/dL (ref 8.7–10.2)
Chloride: 105 mmol/L (ref 96–106)
Creatinine, Ser: 1.06 mg/dL — ABNORMAL HIGH (ref 0.57–1.00)
GFR calc Af Amer: 68 mL/min/{1.73_m2} (ref >59)
GFR calc non Af Amer: 59 mL/min/{1.73_m2} — ABNORMAL LOW (ref >59)
Globulin, Total: 2.5 g/dL (ref 1.5–4.5)
Glucose: 94 mg/dL (ref 65–99)
Potassium: 4.5 mmol/L (ref 3.5–5.2)
Sodium: 142 mmol/L (ref 134–144)
Total Protein: 6.7 g/dL (ref 6.0–8.5)

## 2020-05-04 LAB — CBC WITH DIFFERENTIAL/PLATELET
Basophils Absolute: 0 10*3/uL (ref 0.0–0.2)
Basos: 1 %
EOS (ABSOLUTE): 0.2 10*3/uL (ref 0.0–0.4)
Eos: 3 %
Hematocrit: 39.9 % (ref 34.0–46.6)
Hemoglobin: 13.1 g/dL (ref 11.1–15.9)
Immature Grans (Abs): 0 10*3/uL (ref 0.0–0.1)
Immature Granulocytes: 0 %
Lymphocytes Absolute: 2.1 10*3/uL (ref 0.7–3.1)
Lymphs: 34 %
MCH: 27.2 pg (ref 26.6–33.0)
MCHC: 32.8 g/dL (ref 31.5–35.7)
MCV: 83 fL (ref 79–97)
Monocytes Absolute: 0.5 10*3/uL (ref 0.1–0.9)
Monocytes: 8 %
Neutrophils Absolute: 3.5 10*3/uL (ref 1.4–7.0)
Neutrophils: 54 %
Platelets: 225 10*3/uL (ref 150–450)
RBC: 4.81 x10E6/uL (ref 3.77–5.28)
RDW: 12.9 % (ref 11.7–15.4)
WBC: 6.3 10*3/uL (ref 3.4–10.8)

## 2020-05-04 NOTE — Telephone Encounter (Signed)
Left message for patient to call office back okay for PEC triage to advise patient of results. KW 

## 2020-05-04 NOTE — Progress Notes (Signed)
CBC is within normal limits no signs of anemia or infection. CMP within normal limits for electrolytes and liver function.  Creatinine is mildly elevated I suspect that she still may be a little dehydrated, increase water intake and let us recheck CMP around 3 months 08/04/20 or after.

## 2020-05-04 NOTE — Telephone Encounter (Signed)
-----   Message from Berniece Pap, FNP sent at 05/04/2020  1:06 PM EDT ----- CBC is within normal limits no signs of anemia or infection. CMP within normal limits for electrolytes and liver function.  Creatinine is mildly elevated I suspect that she still may be a little dehydrated, increase water intake and let us recheck CMP around 3 months 08/04/20 or after.

## 2020-05-05 NOTE — Telephone Encounter (Signed)
Patient called, left VM to return the call to the office for lab results.  ?

## 2020-05-06 NOTE — Telephone Encounter (Signed)
Phone call to pt.  Advised of result notes per Marcelino Duster Flinchum from 7/27.  Pt. Verbalized understanding of need to increase water intake and recheck CMP in 3 mos., after 10/27.  Denied further questions.

## 2020-05-25 ENCOUNTER — Telehealth: Payer: Self-pay | Admitting: Adult Health

## 2020-05-25 NOTE — Telephone Encounter (Signed)
[facility] faxed refill request for the following medications:  fluticasone (FLONASE) 50 MCG/ACT nasal spray  Please advise.   Thanks, [facility] 

## 2020-05-26 MED ORDER — FLUTICASONE PROPIONATE 50 MCG/ACT NA SUSP: NASAL | 1 refills | Status: DC

## 2020-05-26 NOTE — Telephone Encounter (Signed)
Refill approved and sent. KW

## 2020-05-31 ENCOUNTER — Ambulatory Visit: Payer: Managed Care, Other (non HMO) | Admitting: Adult Health

## 2020-06-10 ENCOUNTER — Telehealth: Payer: Self-pay | Admitting: Adult Health

## 2020-06-10 MED ORDER — FLUTICASONE PROPIONATE 50 MCG/ACT NA SUSP: NASAL | 1 refills | Status: DC

## 2020-06-10 NOTE — Telephone Encounter (Signed)
[facility] faxed 90 day refill request for the following medications:  fluticasone (FLONASE) 50 MCG/ACT nasal spray   Please advise. Thanks, [employer]

## 2020-06-15 ENCOUNTER — Other Ambulatory Visit: Payer: Self-pay | Admitting: Adult Health

## 2020-06-15 MED ORDER — FLUTICASONE PROPIONATE 50 MCG/ACT NA SUSP: NASAL | 1 refills | Status: AC

## 2020-06-15 NOTE — Telephone Encounter (Signed)
[facility] faxed refill request for the following medications:  fluticasone (FLONASE) 50 MCG/ACT nasal spray  Please advise.   Thanks, [facility] 

## 2020-07-27 ENCOUNTER — Telehealth: Payer: Self-pay

## 2020-07-27 NOTE — Telephone Encounter (Signed)
Called and spoke with patient for clarification and she would [facility] [place] so she can receive her shingrix vaccine. KW

## 2020-07-27 NOTE — Telephone Encounter (Signed)
Copied from CRM [id number]. Topic: General - Other >> Jul 26, 2020  3:12 PM Gwenlyn Fudge wrote: Reason for CRM: Pt called and is requesting to have orders placed for a shingles shot. Please advise.

## 2020-07-28 ENCOUNTER — Other Ambulatory Visit: Payer: Self-pay | Admitting: Adult Health

## 2020-07-28 DIAGNOSIS — Z23 Encounter for immunization: Secondary | ICD-10-CM

## 2020-07-28 MED ORDER — ZOSTER VAC RECOMB ADJUVANTED 50 MCG/0.5ML IM SUSR: 0.5000 mL | Freq: Once | INTRAMUSCULAR | 0 refills | Status: AC

## 2020-07-28 NOTE — Progress Notes (Signed)
Meds ordered this encounter  Medications  . Zoster Vaccine Adjuvanted [facility]) injection    Sig: Inject 0.5 mLs into the muscle once for 1 dose.    Dispense:  0.5 mL    Refill:  0    Screen for immunization screening questions prior. Call if questions.  [facility]

## 2020-07-28 NOTE — Telephone Encounter (Signed)
[facility], she will need to answer immunization screening questions at pharmacy.

## 2020-08-11 ENCOUNTER — Encounter: Payer: Self-pay | Admitting: Adult Health

## 2020-09-06 ENCOUNTER — Encounter: Payer: Self-pay | Admitting: Adult Health

## 2020-11-08 ENCOUNTER — Ambulatory Visit: Payer: Managed Care, Other (non HMO) | Admitting: Adult Health

## 2020-11-11 ENCOUNTER — Ambulatory Visit: Payer: Managed Care, Other (non HMO) | Admitting: Adult Health

## 2020-11-11 ENCOUNTER — Encounter: Payer: Self-pay | Admitting: Adult Health

## 2020-11-11 ENCOUNTER — Other Ambulatory Visit: Payer: Self-pay

## 2020-11-11 ENCOUNTER — Telehealth: Payer: Self-pay

## 2020-11-11 VITALS — BP 123/77 | HR 65 | Resp 16 | Wt 211.4 lb

## 2020-11-11 DIAGNOSIS — K5900 Constipation, unspecified: Secondary | ICD-10-CM

## 2020-11-11 DIAGNOSIS — R7309 Other abnormal glucose: Secondary | ICD-10-CM

## 2020-11-11 DIAGNOSIS — E7800 Pure hypercholesterolemia, unspecified: Secondary | ICD-10-CM | POA: Diagnosis not present

## 2020-11-11 NOTE — Patient Instructions (Signed)
Metamucil per package  Nightly at bedtime.    Constipation, Adult Constipation is when a person has trouble pooping (having a bowel movement). When you have this condition, you may poop fewer than 3 times a week. Your poop (stool) may also be dry, hard, or bigger than normal. Follow these instructions at home: Eating and drinking  Eat foods that have a lot of fiber, such as: ? Fresh fruits and vegetables. ? Whole grains. ? Beans.  Eat less of foods that are low in fiber and high in fat and sugar, such as: ? [place] fries. ? Hamburgers. ? Cookies. ? Candy. ? Soda.  Drink enough fluid to keep your pee (urine) pale yellow.   General instructions  Exercise regularly or as told by your doctor. Try to do 150 minutes of exercise each week.  Go to the restroom when you feel like you need to poop. Do not hold it in.  Take over-the-counter and prescription medicines only as told by your doctor. These include any fiber supplements.  When you poop: ? Do deep breathing while relaxing your lower belly (abdomen). ? Relax your pelvic floor. The pelvic floor is a group of muscles that support the rectum, bladder, and intestines (as well as the uterus in women).  Watch your condition for any changes. Tell your doctor if you notice any.  Keep all follow-up visits as told by your doctor. This is important. Contact a doctor if:  You have pain that gets worse.  You have a fever.  You have not pooped for 4 days.  You vomit.  You are not hungry.  You lose weight.  You are bleeding from the opening of the butt (anus).  You have thin, pencil-like poop. Get help right away if:  You have a fever, and your symptoms suddenly get worse.  You leak poop or have blood in your poop.  Your belly feels hard or bigger than normal (bloated).  You have very bad belly pain.  You feel dizzy or you faint. Summary  Constipation is when a person poops fewer than 3 times a week, has trouble  pooping, or has poop that is dry, hard, or bigger than normal.  Eat foods that have a lot of fiber.  Drink enough fluid to keep your pee (urine) pale yellow.  Take over-the-counter and prescription medicines only as told by your doctor. These include any fiber supplements. This information is not intended to replace advice given to you by your health care provider. Make sure you discuss any questions you have with your health care provider. Document Revised: 08/13/2019 Document Reviewed: 08/13/2019 Elsevier Patient Education  2021 [employer].

## 2020-11-11 NOTE — Telephone Encounter (Signed)
Noted. Thanks.

## 2020-11-11 NOTE — Progress Notes (Signed)
Established patient visit   Patient: Nancy Randolph   DOB: October 31, 1965   55 y.o. Female  MRN: [id number] Visit Date: 11/11/2020  Today's healthcare provider: Jairo Ben, FNP   Chief Complaint  Patient presents with  . Abdominal Pain   Subjective    Abdominal Pain This is a new problem. The current episode started 1 to 4 weeks ago. The problem has been unchanged. The pain is located in the LLQ. The quality of the pain is a sensation of fullness and aching. The abdominal pain does not radiate. Associated symptoms include constipation. Pertinent negatives include no anorexia, arthralgias, belching, diarrhea, dysuria, flatus, frequency, headaches, hematochezia, hematuria, melena, myalgias, nausea, vomiting or weight loss. Nothing aggravates the pain. Relieved by: heating pad. She has tried nothing for the symptoms.    She has been taking chocolate laxative on the weekends.  She reports she has a bowel movement every 3 days to every other day but is not a normal amount. Denies any change in color or pain with bowel movement.   She did have a abdominal tuck in 2017 and she has noticed that her constipation has worsened since then however she reports she did have prior.   Denies any mucous or pus.  She has had colonoscopy in [place] and was normal per patient at [facility].She was advised to have again in 10 years.   Patient  denies any fever, body aches,chills, rash, chest pain, shortness of breath, nausea, vomiting, or diarrhea.  Denies dizziness, lightheadedness, pre syncopal or syncopal episodes.   Patient Active Problem List   Diagnosis Date Noted  . Elevated hemoglobin A1c 11/11/2020  . Elevated LDL cholesterol level 11/11/2020  . Constipation 11/11/2020  . Tension-type headache, not intractable 04/13/2020  . Left hip pain 04/13/2020  . Kidney function abnormal 04/13/2020  . Eustachian tube dysfunction, bilateral 04/13/2020  . Non-recurrent acute serous otitis  media of right ear 04/13/2020  . Acute non-recurrent frontal sinusitis 04/13/2020  . Hair loss 03/01/2020  . Vitamin D deficiency 03/01/2020  . Fatigue 03/01/2020  . Fungal infection of skin 03/01/2020  . Screening cholesterol level 03/01/2020  . Obesity (BMI 30.0-34.9) 08/24/2014  . Abnormal mammogram 08/24/2014  . Groin strain 08/24/2014  . Proximal phalanx fracture of finger 04/30/2014  . Herpes 04/06/2014  . Cough 10/17/2012  . Influenza with respiratory manifestation other than pneumonia 10/10/2012   Past Medical History:  Diagnosis Date  . Blood transfusion without reported diagnosis    Allergies  Allergen Reactions  . Codeine Hives    (codeine) Hives and itching  Hives and itching  Hives and itching    . Oseltamivir Itching    Other reaction(s): Other (See Comments), Other (See Comments)       Medications: Outpatient Medications Prior to Visit  Medication Sig  . calcium elemental as carbonate (BARIATRIC TUMS ULTRA) 400 MG chewable tablet Chew 1,000 mg by mouth daily.  . Cholecalciferol (VITAMIN D3) 50 MCG (2000 UT) TABS Take 50 mcg by mouth daily.  . cyanocobalamin 1000 MCG tablet Take 1,000 mcg by mouth daily.  . fluticasone (FLONASE) 50 MCG/ACT nasal spray SPRAY 2 SPRAYS INTO EACH NOSTRIL EVERY DAY  . ketoconazole (NIZORAL) 2 % shampoo Apply 1 application topically 2 (two) times a week. Apply to area right heel apply 2 x week for 6 weeks. Lather and rinse after 10 minutes.  . NON FORMULARY Take 500 mg by mouth daily. Beet Root  . TURMERIC CURCUMIN PO Take 100  mg by mouth daily.  . cetirizine (ZYRTEC ALLERGY) 10 MG tablet Take 1 tablet (10 mg total) by mouth daily. (Patient not taking: No sig reported)  . esomeprazole (NEXIUM) 20 MG capsule Take 20 mg by mouth as needed. (Patient not taking: No sig reported)   No facility-administered medications prior to visit.    Review of Systems  Constitutional: Positive for fatigue. Negative for activity change, appetite  change, chills, diaphoresis, unexpected weight change and weight loss.  Gastrointestinal: Positive for abdominal pain and constipation. Negative for abdominal distention, anal bleeding, anorexia, blood in stool, diarrhea, flatus, hematochezia, melena, nausea and vomiting.  Genitourinary: Negative for dysuria, frequency and hematuria.  Musculoskeletal: Negative for arthralgias and myalgias.  Neurological: Negative for headaches.    Last CBC Lab Results  Component Value Date   WBC 6.3 05/03/2020   HGB 13.1 05/03/2020   HCT 39.9 05/03/2020   MCV 83 05/03/2020   MCH 27.2 05/03/2020   RDW 12.9 05/03/2020   PLT 225 05/03/2020   Last metabolic panel Lab Results  Component Value Date   GLUCOSE 94 05/03/2020   NA 142 05/03/2020   K 4.5 05/03/2020   CL 105 05/03/2020   CO2 25 05/03/2020   BUN 14 05/03/2020   CREATININE 1.06 (H) 05/03/2020   GFRNONAA 59 (L) 05/03/2020   GFRAA 68 05/03/2020   CALCIUM 9.1 05/03/2020   PROT 6.7 05/03/2020   ALBUMIN 4.2 05/03/2020   LABGLOB 2.5 05/03/2020   AGRATIO 1.7 05/03/2020   BILITOT <0.2 05/03/2020   ALKPHOS 75 05/03/2020   AST 18 05/03/2020   ALT 13 05/03/2020   Last lipids Lab Results  Component Value Date   CHOL 190 03/01/2020   HDL 51 03/01/2020   LDLCALC 123 (H) 03/01/2020   TRIG 87 03/01/2020   CHOLHDL 3.7 03/01/2020   Last hemoglobin A1c Lab Results  Component Value Date   HGBA1C 5.7 (H) 03/01/2020   Last thyroid functions Lab Results  Component Value Date   TSH 1.900 03/01/2020   Last vitamin D Lab Results  Component Value Date   VD25OH 42.1 03/01/2020   Last vitamin B12 and Folate Lab Results  Component Value Date   VITAMINB12 1,018 03/01/2020       Objective    BP 123/77   Pulse 65   Resp 16   Wt 211 lb 6.4 oz (95.9 kg)   SpO2 100%   BMI 34.12 kg/m  BP Readings from Last 3 Encounters:  11/11/20 123/77  05/03/20 (!) 138/78  04/09/20 112/68   Wt Readings from Last 3 Encounters:  11/11/20 211 lb 6.4  oz (95.9 kg)  05/03/20 202 lb 6.4 oz (91.8 kg)  04/09/20 211 lb (95.7 kg)       Physical Exam Vitals and nursing note reviewed.  Constitutional:      Appearance: Normal appearance. She is not ill-appearing.  HENT:     Head: Normocephalic and atraumatic.     Right Ear: External ear normal.     Left Ear: External ear normal.     Nose: Nose normal.     Mouth/Throat:     Mouth: Mucous membranes are moist.  Eyes:     General: No scleral icterus.       Right eye: No discharge.        Left eye: No discharge.     Conjunctiva/sclera: Conjunctivae normal.     Pupils: Pupils are equal, round, and reactive to light.  Cardiovascular:     Rate and Rhythm:  Normal rate and regular rhythm.     Pulses: Normal pulses.     Heart sounds: Normal heart sounds. No murmur heard. No friction rub. No gallop.   Pulmonary:     Effort: Pulmonary effort is normal. No respiratory distress.     Breath sounds: Normal breath sounds. No stridor. No wheezing, rhonchi or rales.  Chest:     Chest wall: No tenderness.  Abdominal:     General: Bowel sounds are normal. There is no distension.     Palpations: Abdomen is soft.     Tenderness: There is abdominal tenderness (mild with palpation deep. ) in the right lower quadrant and left lower quadrant. There is no guarding.     Hernia: No hernia is present.  Genitourinary:    Comments: Deferred.  Musculoskeletal:        General: No tenderness. Normal range of motion.     Cervical back: Normal range of motion and neck supple.     Right lower leg: No edema.     Left lower leg: No edema.  Skin:    General: Skin is warm.     Findings: No erythema, lesion or rash (.attes).  Neurological:     General: No focal deficit present.     Mental Status: She is alert and oriented to person, place, and time.     Motor: No weakness.     Gait: Gait normal.  Psychiatric:        Mood and Affect: Mood normal.        Behavior: Behavior normal.        Thought Content: Thought  content normal.        Judgment: Judgment normal.     No results found for any visits on 11/11/20.  Assessment & Plan     Constipation, unspecified constipation type - Plan: DG Abd 1 View  Elevated LDL cholesterol level - Plan: Lipid Panel w/o Chol/HDL Ratio  Elevated hemoglobin A1c - Plan: HgB A1c, Comprehensive Metabolic Panel (CMET), CBC with Differential/Platelet   Will start metamucil per package instructions as she has not tried this.  Increase fluids and exercise as well as high fiber foods.  Recheck in one month.  KUB ordered.   Red Flags discussed. The patient was given clear instructions to go to ER or return to medical center if any red flags develop, symptoms do not improve, worsen or new problems develop. They verbalized understanding.   Return in about 1 month (around 12/09/2020), or if symptoms worsen or fail to improve, for at any time for any worsening symptoms, Go to Emergency room/ urgent care if worse.      The entirety of the information documented in the History of Present Illness, Review of Systems and Physical Exam were personally obtained by me. Portions of this information were initially documented by the CMA and reviewed by me for thoroughness and accuracy.      Jairo Ben, FNP  [facility] [phone] (phone) [phone] (fax)  [facility]

## 2020-11-11 NOTE — Telephone Encounter (Signed)
Copied from CRM [id number]. Topic: General - Other >> Nov 11, 2020 11:02 AM Darron Doom wrote: Reason for CRM:  Patient called in to inform Nancy Randolph that she want to come in early and have her labs drawn since her appointment is at 4 PM, please call patient at  Ph# [phone]

## 2020-11-11 NOTE — Telephone Encounter (Signed)
Please review chart and advise if okay for labs and which need to be placed in chart.Nancy Randolph

## 2020-11-11 NOTE — Telephone Encounter (Signed)
Patient returned call concerning scheduled appt this afternoon at 4 pm. Patient requested to come in to office early only to have labs drawn prior to scheduled appt. Patient reports she has already eaten lunch today and wants to continue to keep scheduled appt today in the office. Patient will plan for fasting lab work to be drawn on Monday ,if possible and follow up with FNP after lab results come back in. Please advise of times patient can have labs drawn on Monday during OV from [facility] in [address].

## 2020-11-11 NOTE — Telephone Encounter (Signed)
Fyi. kw 

## 2020-11-11 NOTE — Telephone Encounter (Signed)
lmtcb okay for [facility] triage to advise .KW 

## 2020-11-11 NOTE — Telephone Encounter (Signed)
Copied from CRM [id number]. Topic: General - Other >> Nov 11, 2020  9:10 AM Lyn Hollingshead D wrote: PT requesting lab appt prior to her regular appt at 4pm today / please advise

## 2020-11-11 NOTE — Telephone Encounter (Signed)
Cmp recheck per  lab note for elevated creatinine, let her knwo we will not have those results by this afternoon and she should be fasting for this lab. She can reschedule follow up if she wants me to go over lab result in office,

## 2020-11-15 ENCOUNTER — Ambulatory Visit
Admission: RE | Admit: 2020-11-15 | Discharge: 2020-11-15 | Disposition: A | Discharge status: Home / Self Care | Payer: Managed Care, Other (non HMO) | Source: Ambulatory Visit | Attending: Adult Health | Admitting: Adult Health

## 2020-11-15 ENCOUNTER — Other Ambulatory Visit: Payer: Self-pay

## 2020-11-15 ENCOUNTER — Telehealth: Payer: Self-pay

## 2020-11-15 ENCOUNTER — Ambulatory Visit: Payer: Managed Care, Other (non HMO) | Admitting: Adult Health

## 2020-11-15 DIAGNOSIS — K5900 Constipation, unspecified: Secondary | ICD-10-CM | POA: Insufficient documentation

## 2020-11-15 DIAGNOSIS — R8271 Bacteriuria: Secondary | ICD-10-CM

## 2020-11-15 NOTE — Telephone Encounter (Signed)
Copied from CRM [id number]. Topic: General - Other >> Nov 15, 2020 11:14 AM Gwenlyn Fudge wrote: Reason for CRM: Pt called and is requesting to have a UA added to her lab orders. She states that she is checking for stool in her urine. Please advise.

## 2020-11-15 NOTE — Telephone Encounter (Signed)
Urine would have to be collected at the time of lab draw, patient would have to return back to office, please advise if okay to place order.

## 2020-11-16 ENCOUNTER — Ambulatory Visit: Payer: Self-pay | Admitting: *Deleted

## 2020-11-16 LAB — LIPID PANEL W/O CHOL/HDL RATIO
Cholesterol, Total: 193 mg/dL (ref 100–199)
HDL: 45 mg/dL (ref >39)
LDL Chol Calc (NIH): 135 mg/dL — ABNORMAL HIGH (ref 0–99)
Triglycerides: 73 mg/dL (ref 0–149)
VLDL Cholesterol Cal: 13 mg/dL (ref 5–40)

## 2020-11-16 LAB — COMPREHENSIVE METABOLIC PANEL
ALT: 15 IU/L (ref 0–32)
AST: 22 IU/L (ref 0–40)
Albumin/Globulin Ratio: 1.6 (ref 1.2–2.2)
Albumin: 4.3 g/dL (ref 3.8–4.9)
Alkaline Phosphatase: 86 IU/L (ref 44–121)
BUN/Creatinine Ratio: 11 (ref 9–23)
BUN: 12 mg/dL (ref 6–24)
Bilirubin Total: 0.3 mg/dL (ref 0.0–1.2)
CO2: 26 mmol/L (ref 20–29)
Calcium: 9.3 mg/dL (ref 8.7–10.2)
Chloride: 107 mmol/L — ABNORMAL HIGH (ref 96–106)
Creatinine, Ser: 1.06 mg/dL — ABNORMAL HIGH (ref 0.57–1.00)
GFR calc Af Amer: 68 mL/min/{1.73_m2} (ref >59)
GFR calc non Af Amer: 59 mL/min/{1.73_m2} — ABNORMAL LOW (ref >59)
Globulin, Total: 2.7 g/dL (ref 1.5–4.5)
Glucose: 87 mg/dL (ref 65–99)
Potassium: 4.8 mmol/L (ref 3.5–5.2)
Sodium: 146 mmol/L — ABNORMAL HIGH (ref 134–144)
Total Protein: 7 g/dL (ref 6.0–8.5)

## 2020-11-16 LAB — CBC WITH DIFFERENTIAL/PLATELET
Basophils Absolute: 0.1 10*3/uL (ref 0.0–0.2)
Basos: 1 %
EOS (ABSOLUTE): 0.2 10*3/uL (ref 0.0–0.4)
Eos: 3 %
Hematocrit: 41.6 % (ref 34.0–46.6)
Hemoglobin: 13.7 g/dL (ref 11.1–15.9)
Immature Grans (Abs): 0 10*3/uL (ref 0.0–0.1)
Immature Granulocytes: 0 %
Lymphocytes Absolute: 2.7 10*3/uL (ref 0.7–3.1)
Lymphs: 42 %
MCH: 27.2 pg (ref 26.6–33.0)
MCHC: 32.9 g/dL (ref 31.5–35.7)
MCV: 83 fL (ref 79–97)
Monocytes Absolute: 0.5 10*3/uL (ref 0.1–0.9)
Monocytes: 7 %
Neutrophils Absolute: 3 10*3/uL (ref 1.4–7.0)
Neutrophils: 47 %
Platelets: 221 10*3/uL (ref 150–450)
RBC: 5.04 x10E6/uL (ref 3.77–5.28)
RDW: 12.6 % (ref 11.7–15.4)
WBC: 6.4 10*3/uL (ref 3.4–10.8)

## 2020-11-16 LAB — HEMOGLOBIN A1C
Est. average glucose Bld gHb Est-mCnc: 114 mg/dL
Hgb A1c MFr Bld: 5.6 % (ref 4.8–5.6)

## 2020-11-16 NOTE — Telephone Encounter (Signed)
lmtcb okay for [facility] triage to advise. Will place order in chart for patient to return to lab to have urine checked. KW

## 2020-11-16 NOTE — Progress Notes (Signed)
Hemoglobin A1C improved/ CMP stable kidney function.  CBC ok.  Total cholesterol and LDL elevated.  Discuss lifestyle modification with patient e.g. increase exercise, fiber, fruits, vegetables, lean meat, and omega 3/fish intake and decrease saturated fat.  If patient following strict diet and exercise program already please schedule follow up appointment with primary care physician

## 2020-11-16 NOTE — Telephone Encounter (Signed)
Correct needs lab collect, she did not mention any concern of stool in office at the office. If new concern she should schedule follow up visit.

## 2020-11-16 NOTE — Telephone Encounter (Signed)
I called pt and left her a voicemail that she did not need to come back to the office to give a urine sample as the one she did yesterday was in the refrigerator and will be processed. (She gave me permission on my earlier call with her that I could leave a detailed voice message).

## 2020-11-16 NOTE — Telephone Encounter (Signed)
Orer has been placed with specimen left for testing. KW

## 2020-11-16 NOTE — Addendum Note (Signed)
Addended by: Fonda Kinder on: 11/16/2020 11:44 AM   Modules accepted: Orders

## 2020-11-16 NOTE — Telephone Encounter (Signed)
Pt returned a call from the office.    I gave her the message from Sheliah Hatch, CMA from today at 11:39 AM. The order will be placed in the chart for pt to return to lab to have urine checked.  Pt said she left a urine specimen before leaving the office yesterday.   She wrote her name on the cup and put it in the bin to be processed.   It was at lunch time and there was a guy sitting across from the lab that the pt asked him to see that they knew she left a urine specimen for them.   He said he would let them know.  She would like a call back as to whether she needs to come back in and give a urine or not.  She said you may want to check with [facility] in the lab.  She also asked about the abd x [place] that was done.   She wanted the results.    I did not see any interpretation regarding the abd x [place].    Please give her a call back.   She said it was fine to leave a detailed message on her voicemail.    Please let her know if she needed to come back and do another urine sample today.  Thanks  There is a note from [place] also about pt not mentioning stool.   If she has a concern then pt needs to make another appt.   I don't know if pt is aware of this or not as I just saw this note just now after getting off the phone with her.    Please follow up with her when you call her back if she is aware of this note.  Thanks.  I sent these notes to [facility] high priority.

## 2020-11-16 NOTE — Telephone Encounter (Signed)
Spoke with [facility] Tech who states that patient left specimen yesterday and it was refrigerated. Will send for testing patient does not need to return back to lab. KW

## 2020-11-16 NOTE — Progress Notes (Signed)
Moderate constipation x [place] abdomen. Small phlebolith stone or hardened area usually non specific.

## 2020-11-16 NOTE — Telephone Encounter (Signed)
See documentation note where I spoke with pt regarding the urine specimen.   She left one yesterday 11/15/2020.  It's a telephone call with [facility] nurse.

## 2020-11-17 ENCOUNTER — Encounter: Payer: Self-pay | Admitting: Adult Health

## 2020-11-17 LAB — URINALYSIS, ROUTINE W REFLEX MICROSCOPIC
Bilirubin, UA: NEGATIVE
Glucose, UA: NEGATIVE
Ketones, UA: NEGATIVE
Leukocytes,UA: NEGATIVE
Nitrite, UA: NEGATIVE
Protein,UA: NEGATIVE
RBC, UA: NEGATIVE
Specific Gravity, UA: 1.017 (ref 1.005–1.030)
Urobilinogen, Ur: 0.2 mg/dL (ref 0.2–1.0)
pH, UA: 6.5 (ref 5.0–7.5)

## 2020-11-17 NOTE — Telephone Encounter (Signed)
Urine micro within normal limits.

## 2020-11-18 ENCOUNTER — Telehealth: Payer: Self-pay | Admitting: *Deleted

## 2020-11-18 DIAGNOSIS — K5900 Constipation, unspecified: Secondary | ICD-10-CM

## 2020-11-18 NOTE — Addendum Note (Signed)
Addended by: Fonda Kinder on: 11/18/2020 04:58 PM   Modules accepted: Orders

## 2020-11-18 NOTE — Telephone Encounter (Signed)
Ok to place referral to GI as requested to [facility]. Thanks.

## 2020-11-18 NOTE — Telephone Encounter (Signed)
Patient returned call for x-[place] results and advised of following: You are moderately constipated, increasing fluids and metamucil as directed in office. You can also add a colace stool softner per package instructions.  Warm prune, apple juice and increased fruits and veggies will help.   If constipation continues I recommend refferal to gastroenterology, you can try the above for one month and see if you get any relief and if you do not then we will place refferals.  Patient request referral to GI in [place] area- preferably one associated with [facility] or [facility].

## 2021-02-10 ENCOUNTER — Other Ambulatory Visit: Payer: Self-pay | Admitting: Podiatry

## 2021-02-10 ENCOUNTER — Ambulatory Visit
Admission: RE | Admit: 2021-02-10 | Discharge: 2021-02-10 | Disposition: A | Discharge status: Home / Self Care | Payer: Managed Care, Other (non HMO) | Source: Ambulatory Visit | Attending: Podiatry | Admitting: Podiatry

## 2021-02-10 ENCOUNTER — Other Ambulatory Visit: Payer: Self-pay

## 2021-02-10 DIAGNOSIS — R600 Localized edema: Secondary | ICD-10-CM | POA: Diagnosis present

## 2021-02-10 DIAGNOSIS — R0989 Other specified symptoms and signs involving the circulatory and respiratory systems: Secondary | ICD-10-CM | POA: Diagnosis present

## 2021-02-10 DIAGNOSIS — M79662 Pain in left lower leg: Secondary | ICD-10-CM | POA: Diagnosis present

## 2021-02-15 ENCOUNTER — Telehealth (INDEPENDENT_AMBULATORY_CARE_PROVIDER_SITE_OTHER): Payer: Self-pay

## 2021-02-15 NOTE — Telephone Encounter (Signed)
Documentation only.

## 2021-03-21 ENCOUNTER — Other Ambulatory Visit: Payer: Self-pay

## 2021-03-21 ENCOUNTER — Ambulatory Visit (INDEPENDENT_AMBULATORY_CARE_PROVIDER_SITE_OTHER): Payer: Managed Care, Other (non HMO) | Admitting: Vascular Surgery

## 2021-03-21 ENCOUNTER — Other Ambulatory Visit (INDEPENDENT_AMBULATORY_CARE_PROVIDER_SITE_OTHER): Payer: Self-pay | Admitting: Adult Health

## 2021-03-21 ENCOUNTER — Other Ambulatory Visit (INDEPENDENT_AMBULATORY_CARE_PROVIDER_SITE_OTHER): Payer: Managed Care, Other (non HMO)

## 2021-03-21 ENCOUNTER — Other Ambulatory Visit (INDEPENDENT_AMBULATORY_CARE_PROVIDER_SITE_OTHER): Payer: Self-pay | Admitting: Vascular Surgery

## 2021-03-21 VITALS — BP 112/76 | HR 86 | Ht 66.0 in | Wt 216.0 lb

## 2021-03-21 DIAGNOSIS — M7989 Other specified soft tissue disorders: Secondary | ICD-10-CM

## 2021-03-21 DIAGNOSIS — E7800 Pure hypercholesterolemia, unspecified: Secondary | ICD-10-CM

## 2021-03-21 DIAGNOSIS — I890 Lymphedema, not elsewhere classified: Secondary | ICD-10-CM | POA: Diagnosis not present

## 2021-03-21 DIAGNOSIS — I83813 Varicose veins of bilateral lower extremities with pain: Secondary | ICD-10-CM

## 2021-03-21 DIAGNOSIS — M79669 Pain in unspecified lower leg: Secondary | ICD-10-CM

## 2021-03-21 DIAGNOSIS — R600 Localized edema: Secondary | ICD-10-CM | POA: Diagnosis not present

## 2021-03-24 ENCOUNTER — Encounter (INDEPENDENT_AMBULATORY_CARE_PROVIDER_SITE_OTHER): Payer: Self-pay | Admitting: Vascular Surgery

## 2021-03-24 DIAGNOSIS — I890 Lymphedema, not elsewhere classified: Secondary | ICD-10-CM | POA: Insufficient documentation

## 2021-03-24 DIAGNOSIS — M7989 Other specified soft tissue disorders: Secondary | ICD-10-CM | POA: Insufficient documentation

## 2021-03-24 NOTE — Progress Notes (Signed)
MRN : [id number]  Nancy Randolph is a 55 y.o. (08/19/1966) female who presents with chief complaint of  Chief Complaint  Patient presents with   New Patient (Initial Visit)    Pain of LLE L leg edema referred by Alberteen Spindle  .  History of Present Illness:   Patient is seen for evaluation of leg pain and leg swelling. The patient first noticed the swelling remotely. The swelling is associated with pain and discoloration. The pain and swelling worsens with prolonged dependency and improves with elevation. The pain is unrelated to activity.  The patient notes that in the morning the legs are significantly improved but they steadily worsened throughout the course of the day. The patient also notes a steady worsening of the discoloration in the ankle and shin area.   The patient denies claudication symptoms.  The patient denies symptoms consistent with rest pain.  The patient denies and extensive history of DJD and LS spine disease.  The patient has no had any past angiography, interventions or vascular surgery.  Elevation makes the leg symptoms better, dependency makes them much worse. There is no history of ulcerations. The patient denies any recent changes in medications.  The patient has not been wearing graduated compression.  The patient denies a history of DVT or PE. There is no prior history of phlebitis. There is no history of primary lymphedema.  No history of malignancies. No history of trauma or groin or pelvic surgery. There is no history of radiation treatment to the groin or pelvis  The patient denies amaurosis fugax or recent TIA symptoms. There are no recent neurological changes noted. The patient denies recent episodes of angina or shortness of breath   Previous duplex ultrasound is negative for DVT  Current Meds  Medication Sig   calcium elemental as carbonate (BARIATRIC TUMS ULTRA) 400 MG chewable tablet Chew 1,000 mg by mouth daily.   cetirizine (ZYRTEC ALLERGY) 10 MG  tablet Take 1 tablet (10 mg total) by mouth daily.   Cholecalciferol (VITAMIN D3) 50 MCG (2000 UT) TABS Take 50 mcg by mouth daily.   cyanocobalamin 1000 MCG tablet Take 1,000 mcg by mouth daily.   esomeprazole (NEXIUM) 20 MG capsule Take 20 mg by mouth as needed.   fluticasone (FLONASE) 50 MCG/ACT nasal spray SPRAY 2 SPRAYS INTO EACH NOSTRIL EVERY DAY   ketoconazole (NIZORAL) 2 % shampoo Apply 1 application topically 2 (two) times a week. Apply to area right heel apply 2 x week for 6 weeks. Lather and rinse after 10 minutes.   NON FORMULARY Take 500 mg by mouth daily. Beet Root   TURMERIC CURCUMIN PO Take 100 mg by mouth daily.    Past Medical History:  Diagnosis Date   Blood transfusion without reported diagnosis     Past Surgical History:  Procedure Laterality Date   CESAREAN SECTION  2993,7169   COSMETIC SURGERY  06/2015    Social History Social History   Tobacco Use   Smoking status: Never   Smokeless tobacco: Never  Substance Use Topics   Alcohol use: Not Currently   Drug use: Never    Family History Family History  Problem Relation Age of Onset   Breast cancer Maternal Aunt     Allergies  Allergen Reactions   Codeine Hives    (codeine) Hives and itching  Hives and itching  Hives and itching     Oseltamivir Itching    Other reaction(s): Other (See Comments), Other (See Comments)  REVIEW OF SYSTEMS (Negative unless checked)  Constitutional: [] Weight loss  [] Fever  [] Chills Cardiac: [] Chest pain   [] Chest pressure   [] Palpitations   [] Shortness of breath when laying flat   [] Shortness of breath with exertion. Vascular:  [] Pain in legs with walking   [x] Pain in legs at rest  [] History of DVT   [] Phlebitis   [x] Swelling in legs   [] Varicose veins   [] Non-healing ulcers Pulmonary:   [] Uses home oxygen   [] Productive cough   [] Hemoptysis   [] Wheeze  [] COPD   [] Asthma Neurologic:  [] Dizziness   [] Seizures   [] History of stroke   [] History of TIA  [] Aphasia    [] Vissual changes   [] Weakness or numbness in arm   [] Weakness or numbness in leg Musculoskeletal:   [] Joint swelling   [x] Joint pain   [] Low back pain Hematologic:  [] Easy bruising  [] Easy bleeding   [] Hypercoagulable state   [] Anemic Gastrointestinal:  [] Diarrhea   [] Vomiting  [] Gastroesophageal reflux/heartburn   [] Difficulty swallowing. Genitourinary:  [] Chronic kidney disease   [] Difficult urination  [] Frequent urination   [] Blood in urine Skin:  [] Rashes   [] Ulcers  Psychological:  [] History of anxiety   []  History of major depression.  Physical Examination  Vitals:   03/21/21 1020  BP: 112/76  Pulse: 86  Weight: 216 lb (98 kg)  Height: 5\' 6"  (1.676 m)   Body mass index is 34.86 kg/m. Gen: WD/WN, NAD Head: [place]/AT, No temporalis wasting.  Ear/Nose/Throat: Hearing grossly intact, nares w/o erythema or drainage Eyes: PER, EOMI, sclera nonicteric.  Neck: Supple, no large masses.   Pulmonary:  Good air movement, no audible wheezing bilaterally, no use of accessory muscles.  Cardiac: RRR, no JVD Vascular:  scattered varicosities present bilaterally.  Mild venous stasis changes to the legs bilaterally.  3-4+ soft pitting edema  Vessel Right Left  Radial Palpable Palpable  Gastrointestinal: Non-distended. No guarding/no peritoneal signs.  Musculoskeletal: M/S 5/5 throughout.  No deformity or atrophy.  Neurologic: CN 2-12 intact. Symmetrical.  Speech is fluent. Motor exam as listed above. Psychiatric: Judgment intact, Mood & affect appropriate for pt's clinical situation. Dermatologic: Venous rashes no ulcers noted.  No changes consistent with cellulitis. Lymph : No lichenification or skin changes of chronic lymphedema.  CBC Lab Results  Component Value Date   WBC 6.4 11/15/2020   HGB 13.7 11/15/2020   HCT 41.6 11/15/2020   MCV 83 11/15/2020   PLT 221 11/15/2020    BMET    Component Value Date/Time   NA 146 (H) 11/15/2020 1146   K 4.8 11/15/2020 1146   CL 107 (H)  11/15/2020 1146   CO2 26 11/15/2020 1146   GLUCOSE 87 11/15/2020 1146   BUN 12 11/15/2020 1146   CREATININE 1.06 (H) 11/15/2020 1146   CALCIUM 9.3 11/15/2020 1146   GFRNONAA 59 (L) 11/15/2020 1146   GFRAA 68 11/15/2020 1146   CrCl cannot be calculated (Patient's most recent lab result is older than the maximum 21 days allowed.).  COAG No results found for: INR, PROTIME  Radiology VAS LOWER EXTREMITY VENOUS REFLUX  Result Date: 03/21/2021  Lower Venous Reflux Study Patient Name:  DORIANA MAZURKIEWICZ  Date of Exam:   03/21/2021 Medical Rec #:       Accession #:    Date of Birth: 1965-11-08      Patient Gender: F Patient Age:   51Y Exam Location:  [facility] Procedure:      VAS LOWER EXTREMITY  VENOUS REFLUX Referring Phys: [id number] Latina Craver Othell Jaime --------------------------------------------------------------------------------  Indications: Varicosities.  Performing Technologist: Debbe Bales RVS  Examination Guidelines: A complete evaluation includes B-mode imaging, spectral Doppler, color Doppler, and power Doppler as needed of all accessible portions of each vessel. Bilateral testing is considered an integral part of a complete examination. Limited examinations for reoccurring indications may be performed as noted. The reflux portion of the exam is performed with the patient in reverse Trendelenburg. Significant venous reflux is defined as >500 ms in the superficial venous system, and >1 second in the deep venous system.  Venous Reflux Times +--------------+---------+------+-----------+------------+--------+ RIGHT         Reflux NoRefluxReflux TimeDiameter cmsComments                         Yes                                  +--------------+---------+------+-----------+------------+--------+ CFV           no                                             +--------------+---------+------+-----------+------------+--------+ FV prox       no                                              +--------------+---------+------+-----------+------------+--------+ FV mid        no                                             +--------------+---------+------+-----------+------------+--------+ FV dist       no                                             +--------------+---------+------+-----------+------------+--------+ Popliteal     no                                             +--------------+---------+------+-----------+------------+--------+ GSV at SFJ    no                            .59              +--------------+---------+------+-----------+------------+--------+ GSV prox thighno                            .48              +--------------+---------+------+-----------+------------+--------+ GSV mid thigh no                                    NWV      +--------------+---------+------+-----------+------------+--------+ GSV dist thighno  NWV      +--------------+---------+------+-----------+------------+--------+ GSV at knee   no                            .23              +--------------+---------+------+-----------+------------+--------+ GSV prox calf                               .23              +--------------+---------+------+-----------+------------+--------+  +--------------+---------+------+-----------+------------+--------+ LEFT          Reflux NoRefluxReflux TimeDiameter cmsComments                         Yes                                  +--------------+---------+------+-----------+------------+--------+ CFV           no                                             +--------------+---------+------+-----------+------------+--------+ FV prox       no                                             +--------------+---------+------+-----------+------------+--------+ FV mid        no                                              +--------------+---------+------+-----------+------------+--------+ FV dist       no                                             +--------------+---------+------+-----------+------------+--------+ Popliteal     no                                             +--------------+---------+------+-----------+------------+--------+ GSV at SFJ    no                            .35              +--------------+---------+------+-----------+------------+--------+ GSV prox thighno                            .36              +--------------+---------+------+-----------+------------+--------+ GSV mid thigh no                                    NWV      +--------------+---------+------+-----------+------------+--------+ GSV dist thighno  NWV      +--------------+---------+------+-----------+------------+--------+ GSV at knee   no                                    NWV      +--------------+---------+------+-----------+------------+--------+ GSV prox calf                                       NWV      +--------------+---------+------+-----------+------------+--------+   Summary: Bilateral: - No evidence of deep vein thrombosis seen in the lower extremities, bilaterally, from the common femoral through the popliteal veins.  - No evidence of superficial venous thrombosis in the lower extremities, bilaterally.  - No evidence of deep venous insufficiency seen bilaterally in the lower extremity.  - No evidence of superficial venous reflux seen in the greater saphenous veins bilaterally.  *See table(s) above for measurements and observations. Electronically signed by Levora DredgeGregory Vertie Dibbern MD on 03/21/2021 at 4:32:38 PM.    Final       Assessment/Plan 1. Lymphedema Recommend:  No surgery or intervention at this point in time.    I have reviewed my previous discussion with the patient regarding swelling and why it causes symptoms.  Patient will  continue wearing graduated compression stockings class 1 (20-30 mmHg) on a daily basis. The patient will  beginning wearing the stockings first thing in the morning and removing them in the evening. The patient is instructed specifically not to sleep in the stockings.    In addition, behavioral modification including several periods of elevation of the lower extremities during the day will be continued.  This was reviewed with the patient during the initial visit.  The patient will also continue routine exercise, especially walking on a daily basis as was discussed during the initial visit.    Despite conservative treatments including graduated compression therapy class 1 and behavioral modification including exercise and elevation the patient  has not obtained adequate control of the lymphedema.  The patient still has stage 3 lymphedema and therefore, I believe that a lymph pump should be added to improve the control of the patient's lymphedema.  Additionally, a lymph pump is warranted because it will reduce the risk of cellulitis and ulceration in the future.  Patient should follow-up in six months   - VAS US LOWER EXTREMITY VENOUS REFLUX; Future  2. Pain and swelling of lower leg, unspecified laterality Recommend:  No surgery or intervention at this point in time.    I have reviewed my previous discussion with the patient regarding swelling and why it causes symptoms.  Patient will continue wearing graduated compression stockings class 1 (20-30 mmHg) on a daily basis. The patient will  beginning wearing the stockings first thing in the morning and removing them in the evening. The patient is instructed specifically not to sleep in the stockings.    In addition, behavioral modification including several periods of elevation of the lower extremities during the day will be continued.  This was reviewed with the patient during the initial visit.  The patient will also continue routine exercise,  especially walking on a daily basis as was discussed during the initial visit.    Despite conservative treatments including graduated compression therapy class 1 and behavioral modification including exercise and elevation the patient  has not obtained adequate control of the lymphedema.  The  patient still has stage 3 lymphedema and therefore, I believe that a lymph pump should be added to improve the control of the patient's lymphedema.  Additionally, a lymph pump is warranted because it will reduce the risk of cellulitis and ulceration in the future.  Patient should follow-up in six months   - VAS [place] LOWER EXTREMITY VENOUS REFLUX; Future  3. Elevated LDL cholesterol level Continue statin as ordered and reviewed, no changes at this time    Levora Dredge, MD  03/24/2021 12:01 PM

## 2021-09-26 ENCOUNTER — Ambulatory Visit (INDEPENDENT_AMBULATORY_CARE_PROVIDER_SITE_OTHER): Payer: Managed Care, Other (non HMO) | Admitting: Vascular Surgery

## 2021-10-20 ENCOUNTER — Ambulatory Visit (INDEPENDENT_AMBULATORY_CARE_PROVIDER_SITE_OTHER): Payer: Managed Care, Other (non HMO) | Admitting: Vascular Surgery

## 2021-11-02 NOTE — Progress Notes (Signed)
MRN : [id number]  Nancy Randolph is a 56 y.o. (05/24/1966) female who presents with chief complaint of leg swelling.  History of Present Illness:   The patient returns to the office for followup evaluation regarding leg swelling and foot pain.  She feels like there is a tender vein crossing the lateral dorsal aspect of her foot under the lateral malleolus. Compression has not really helped.  Since the previous visit the patient has been wearing graduated compression stockings.    No outpatient medications have been marked as taking for the 11/03/21 encounter (Appointment) with Delana Meyer, Dolores Lory, MD.    Past Medical History:  Diagnosis Date   Blood transfusion without reported diagnosis     Past Surgical History:  Procedure Laterality Date   CESAREAN SECTION  [id number]   COSMETIC SURGERY  06/2015    Social History Social History   Tobacco Use   Smoking status: Never   Smokeless tobacco: Never  Substance Use Topics   Alcohol use: Not Currently   Drug use: Never    Family History Family History  Problem Relation Age of Onset   Breast cancer Maternal Aunt     Allergies  Allergen Reactions   Codeine Hives    (codeine) Hives and itching  Hives and itching  Hives and itching     Oseltamivir Itching    Other reaction(s): Other (See Comments), Other (See Comments)     REVIEW OF SYSTEMS (Negative unless checked)  Constitutional: [] Weight loss  [] Fever  [] Chills Cardiac: [] Chest pain   [] Chest pressure   [] Palpitations   [] Shortness of breath when laying flat   [] Shortness of breath with exertion. Vascular:  [] Pain in legs with walking   [] Pain in legs at rest  [] History of DVT   [] Phlebitis   [x] Swelling in legs   [] Varicose veins   [] Non-healing ulcers Pulmonary:   [] Uses home oxygen   [] Productive cough   [] Hemoptysis   [] Wheeze  [] COPD   [] Asthma Neurologic:  [] Dizziness   [] Seizures   [] History of stroke   [] History of TIA  [] Aphasia   [] Vissual changes    [] Weakness or numbness in arm   [] Weakness or numbness in leg Musculoskeletal:   [] Joint swelling   [] Joint pain   [] Low back pain Hematologic:  [] Easy bruising  [] Easy bleeding   [] Hypercoagulable state   [] Anemic Gastrointestinal:  [] Diarrhea   [] Vomiting  [] Gastroesophageal reflux/heartburn   [] Difficulty swallowing. Genitourinary:  [] Chronic kidney disease   [] Difficult urination  [] Frequent urination   [] Blood in urine Skin:  [] Rashes   [] Ulcers  Psychological:  [] History of anxiety   []  History of major depression.  Physical Examination  There were no vitals filed for this visit. There is no height or weight on file to calculate BMI. Gen: WD/WN, NAD Head: [place]/AT, No temporalis wasting.  Ear/Nose/Throat: Hearing grossly intact, nares w/o erythema or drainage, pinna without lesions Eyes: PER, EOMI, sclera nonicteric.  Neck: Supple, no gross masses.  No JVD.  Pulmonary:  Good air movement, no audible wheezing, no use of accessory muscles.  Cardiac: RRR, precordium not hyperdynamic. Vascular:  scattered varicosities present bilaterally.  Mild venous stasis changes to the legs bilaterally.  2+ soft pitting edema.  I do not feel a cord or palpable vein in the right foot Vessel Right Left  Radial Palpable Palpable  Gastrointestinal: soft, non-distended. No guarding/no peritoneal signs.  Musculoskeletal: M/S 5/5 throughout.  No deformity.  Neurologic: CN 2-12 intact. Pain and light touch intact in  extremities.  Symmetrical.  Speech is fluent. Motor exam as listed above. Psychiatric: Judgment intact, Mood & affect appropriate for pt's clinical situation. Dermatologic: Venous rashes no ulcers noted.  No changes consistent with cellulitis. Lymph : No lichenification or skin changes of chronic lymphedema.  CBC Lab Results  Component Value Date   WBC 6.4 11/15/2020   HGB 13.7 11/15/2020   HCT 41.6 11/15/2020   MCV 83 11/15/2020   PLT 221 11/15/2020    BMET    Component Value  Date/Time   NA 146 (H) 11/15/2020 1146   K 4.8 11/15/2020 1146   CL 107 (H) 11/15/2020 1146   CO2 26 11/15/2020 1146   GLUCOSE 87 11/15/2020 1146   BUN 12 11/15/2020 1146   CREATININE 1.06 (H) 11/15/2020 1146   CALCIUM 9.3 11/15/2020 1146   GFRNONAA 59 (L) 11/15/2020 1146   GFRAA 68 11/15/2020 1146   CrCl cannot be calculated (Patient's most recent lab result is older than the maximum 21 days allowed.).  COAG No results found for: INR, PROTIME  Radiology No results found.   Assessment/Plan 1. Lymphedema No surgery or intervention at this point in time.  I have reviewed my discussion with the patient regarding venous insufficiency and why it causes symptoms. I have discussed with the patient the chronic skin changes that accompany venous insufficiency and the long term sequela such as ulceration. Patient will contnue wearing graduated compression stockings on a daily basis, as this has provided excellent control of his edema. The patient will put the stockings on first thing in the morning and removing them in the evening. The patient is reminded not to sleep in the stockings.  In addition, behavioral modification including elevation during the day will be initiated. Exercise is strongly encouraged.  Given the patient's good control and lack of any problems regarding the venous insufficiency and lymphedema a lymph pump in not need at this time.  The patient will follow up with me PRN should anything change.  The patient voices agreement with this plan.   2. Elevated LDL cholesterol level Continue statin as ordered and reviewed, no changes at this time    Hortencia Pilar, MD  11/02/2021 1:19 PM

## 2021-11-03 ENCOUNTER — Ambulatory Visit (INDEPENDENT_AMBULATORY_CARE_PROVIDER_SITE_OTHER): Payer: Managed Care, Other (non HMO) | Admitting: Vascular Surgery

## 2021-11-03 ENCOUNTER — Other Ambulatory Visit: Payer: Self-pay

## 2021-11-03 ENCOUNTER — Encounter (INDEPENDENT_AMBULATORY_CARE_PROVIDER_SITE_OTHER): Payer: Self-pay | Admitting: Vascular Surgery

## 2021-11-03 VITALS — BP 120/80 | HR 91 | Resp 16 | Wt 218.4 lb

## 2021-11-03 DIAGNOSIS — I890 Lymphedema, not elsewhere classified: Secondary | ICD-10-CM

## 2021-11-03 DIAGNOSIS — E7800 Pure hypercholesterolemia, unspecified: Secondary | ICD-10-CM

## 2021-11-06 ENCOUNTER — Encounter (INDEPENDENT_AMBULATORY_CARE_PROVIDER_SITE_OTHER): Payer: Self-pay | Admitting: Vascular Surgery

## 2022-08-07 ENCOUNTER — Encounter (INDEPENDENT_AMBULATORY_CARE_PROVIDER_SITE_OTHER): Payer: Self-pay

## 2023-02-28 IMAGING — US US EXTREM LOW VENOUS*L*
1 series · 13 of 24 positions shown · non-contrast
Comparison: None.

CLINICAL DATA: Lower extremity pain and edema

EXAM:
LEFT LOWER EXTREMITY VENOUS DUPLEX ULTRASOUND
TECHNIQUE: Gray-scale sonography with graded compression, as well as color
Doppler and duplex ultrasound were performed to evaluate the left
lower extremity deep venous system from the level of the common
femoral vein and including the common femoral, femoral, profunda
femoral, popliteal and calf veins including the posterior tibial,
peroneal and gastrocnemius veins when visible. The superficial great
saphenous vein was also interrogated. Spectral Doppler was utilized
to evaluate flow at rest and with distal augmentation maneuvers in
the common femoral, femoral and popliteal veins.

[Series 1: us venous img lower uni left (dvt) · portal-venous · 13 of 34 slices shown]
[im 1/34]
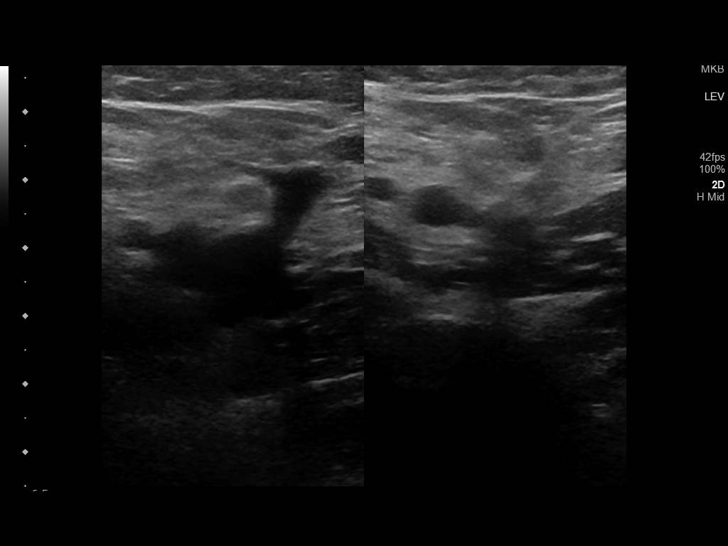
[im 3/34]
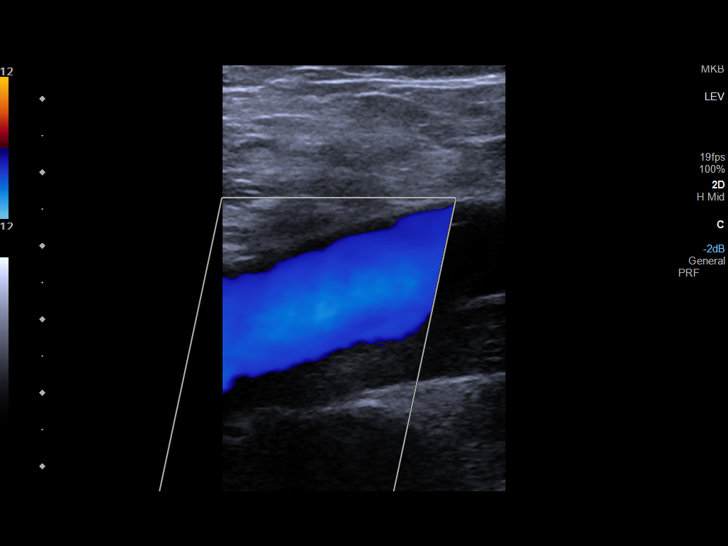
[im 6/34]
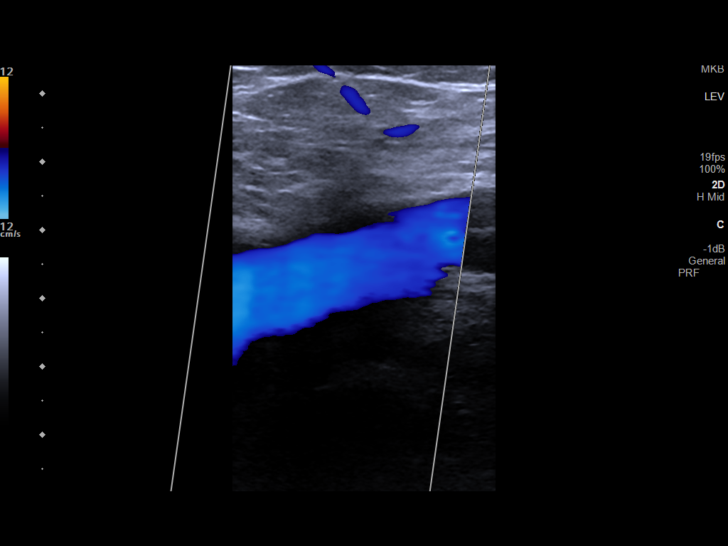
[im 9/34]
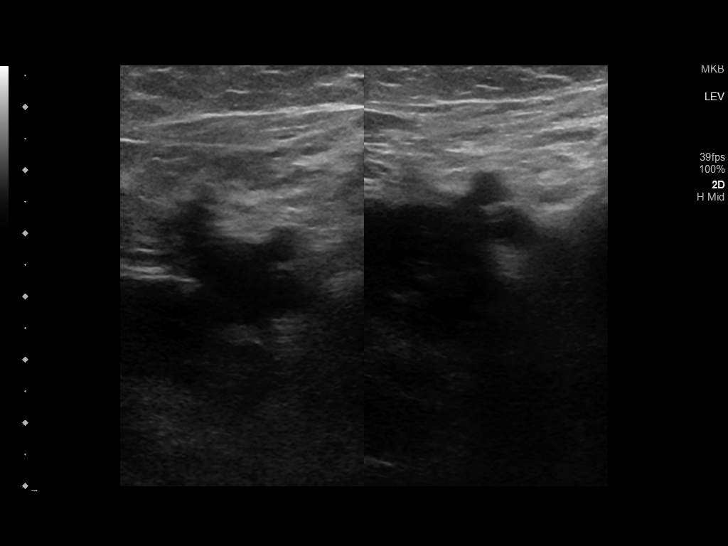
[im 12/34]
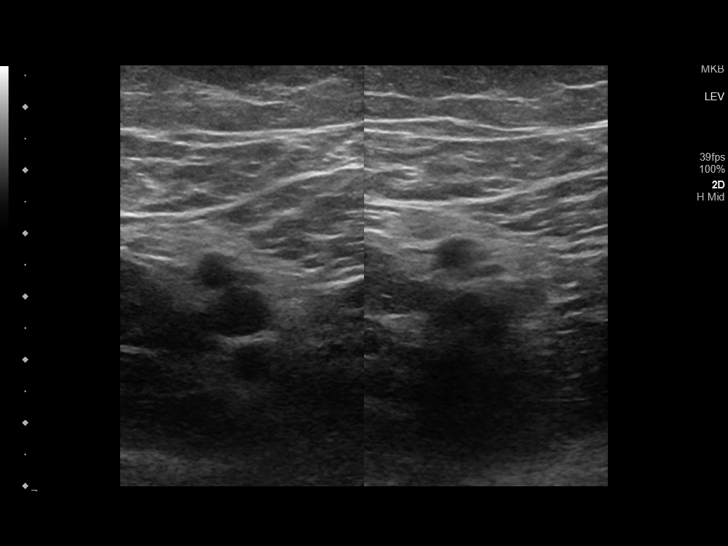
[im 15/34]
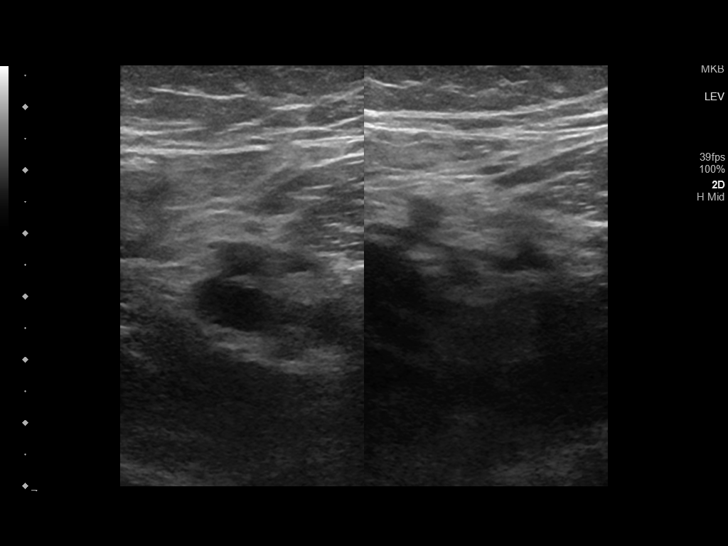
[im 18/34]
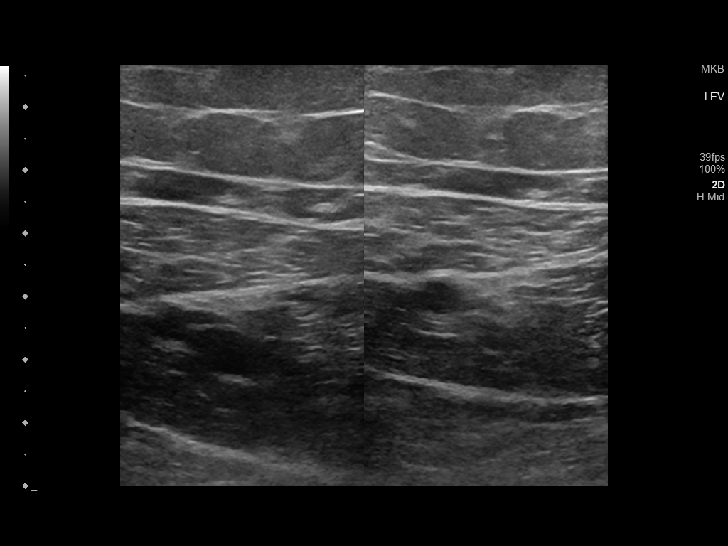
[im 19/34]
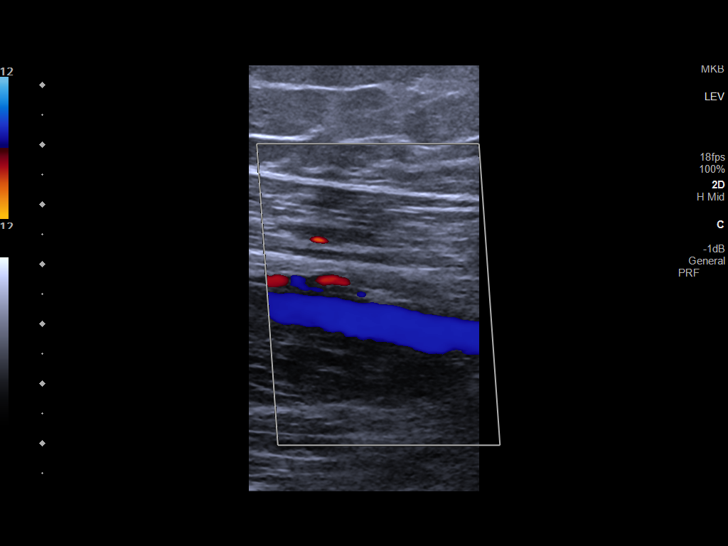
[im 22/34]
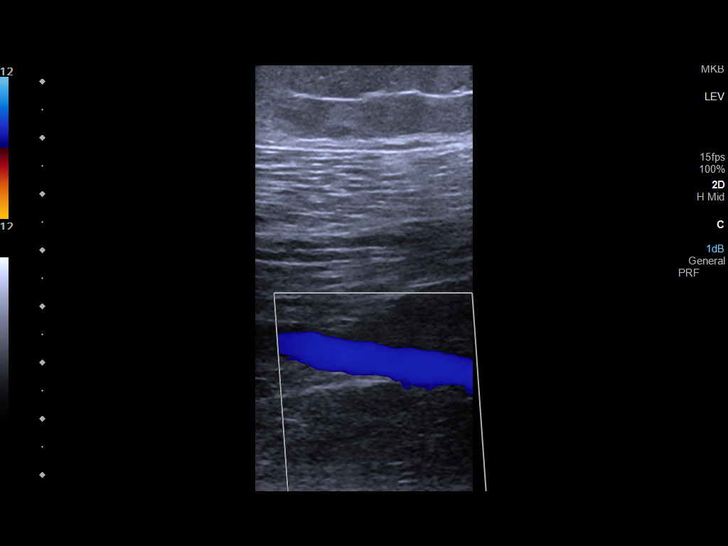
[im 25/34]
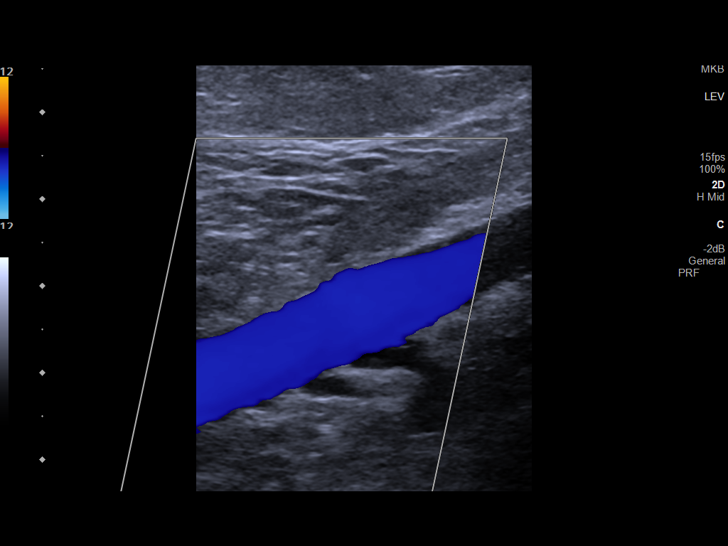
[im 28/34]
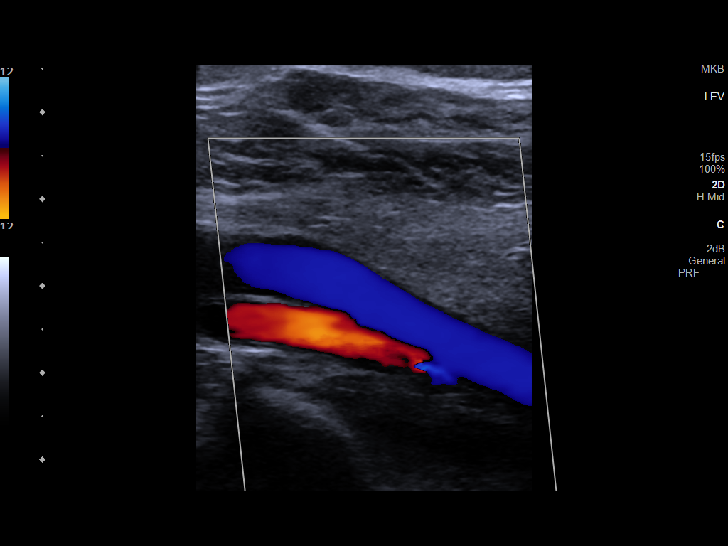
[im 31/34]
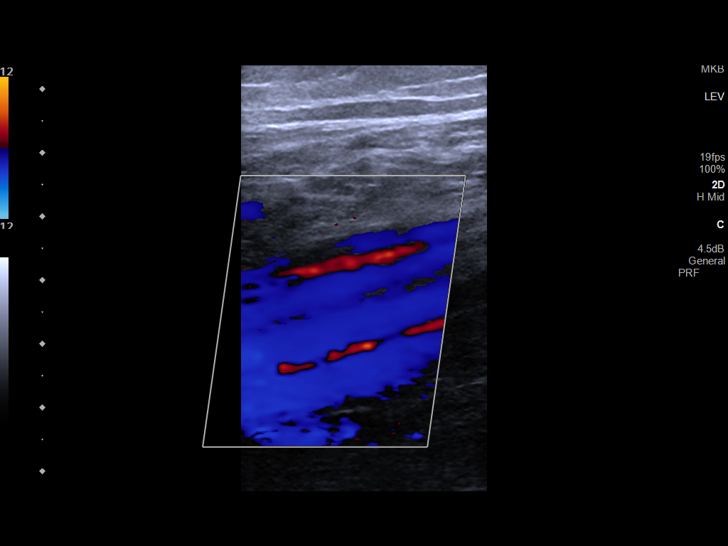
[im 34/34]
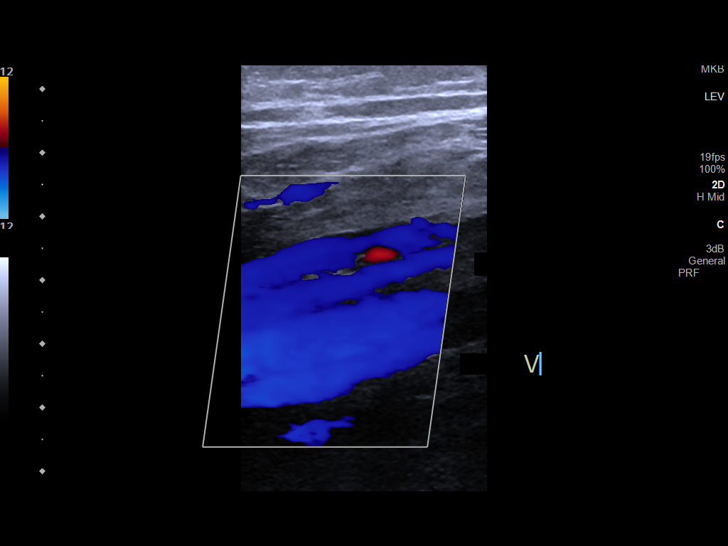

[13 of 24 positions shown; findings below may reference images not displayed]

FINDINGS: Contralateral Common Femoral Vein: Respiratory phasicity is normal
and symmetric with the symptomatic side. No evidence of thrombus.
Normal compressibility.

Common Femoral Vein: No evidence of thrombus. Normal
compressibility, respiratory phasicity and response to augmentation.

Saphenofemoral Junction: No evidence of thrombus. Normal
compressibility and flow on color Doppler imaging.

Profunda Femoral Vein: No evidence of thrombus. Normal
compressibility and flow on color Doppler imaging.

Femoral Vein: No evidence of thrombus. Normal compressibility,
respiratory phasicity and response to augmentation.

Popliteal Vein: No evidence of thrombus. Normal compressibility,
respiratory phasicity and response to augmentation.

Calf Veins: No evidence of thrombus. Normal compressibility and flow
on color Doppler imaging.

Superficial Great Saphenous Vein: No evidence of thrombus. Normal
compressibility.

Venous Reflux:  None.

Other Findings:  None.
IMPRESSION: No evidence of deep venous thrombosis in the left lower extremity.
Right common femoral vein patent.
